# Patient Record
Sex: Female | Born: 1995 | Race: Black or African American | Hispanic: No | Marital: Single | Smoking: Former smoker
Health system: Southern US, Community
[De-identification: ages and names within clinical notes are randomized; demographics above are authoritative.]

## PROBLEM LIST (undated history)

## (undated) ENCOUNTER — Inpatient Hospital Stay (HOSPITAL_COMMUNITY): Payer: Self-pay

## (undated) ENCOUNTER — Ambulatory Visit (HOSPITAL_COMMUNITY): Admission: EM | Source: Home / Self Care

## (undated) DIAGNOSIS — I10 Essential (primary) hypertension: Secondary | ICD-10-CM

## (undated) DIAGNOSIS — J45909 Unspecified asthma, uncomplicated: Secondary | ICD-10-CM

## (undated) DIAGNOSIS — O24419 Gestational diabetes mellitus in pregnancy, unspecified control: Secondary | ICD-10-CM

## (undated) HISTORY — DX: Unspecified asthma, uncomplicated: J45.909

## (undated) HISTORY — PX: NO PAST SURGERIES: SHX2092

---

## 2016-09-27 ENCOUNTER — Encounter (HOSPITAL_COMMUNITY): Payer: Self-pay | Admitting: *Deleted

## 2016-09-27 ENCOUNTER — Ambulatory Visit (HOSPITAL_COMMUNITY)
Admission: EM | Admit: 2016-09-27 | Discharge: 2016-09-27 | Disposition: A | Discharge status: Home / Self Care | Payer: Medicaid Other | Attending: Emergency Medicine | Admitting: Emergency Medicine

## 2016-09-27 DIAGNOSIS — K5901 Slow transit constipation: Secondary | ICD-10-CM | POA: Diagnosis not present

## 2016-09-27 DIAGNOSIS — R112 Nausea with vomiting, unspecified: Secondary | ICD-10-CM | POA: Diagnosis not present

## 2016-09-27 DIAGNOSIS — N912 Amenorrhea, unspecified: Secondary | ICD-10-CM | POA: Diagnosis not present

## 2016-09-27 DIAGNOSIS — R110 Nausea: Secondary | ICD-10-CM

## 2016-09-27 MED ORDER — ONDANSETRON HCL 4 MG PO TABS: 4.0000 mg | ORAL_TABLET | Freq: Four times a day (QID) | ORAL | 0 refills | Status: DC

## 2016-09-27 NOTE — Discharge Instructions (Signed)
Your pregnancy test was negative. Uncertain as to the reason you are having nausea and vomiting. Possibly due to constipation. Recommend you utilize the MiraLAX as discussed. Drink more fluids. He may use the Zofran for nausea and vomiting as needed. Follow-up with your primary care provider or gynecologist as needed for menstrual problems.

## 2016-09-27 NOTE — ED Triage Notes (Signed)
Pt  Nausea    And     Late   On   On  Period   Vomiting    Craving   Weird  Food     Has  Not    Taken  A  Home  preg  Test

## 2016-09-27 NOTE — ED Provider Notes (Signed)
CSN: [id number]     Arrival date & time 09/27/16  1316 History   First MD Initiated Contact with Patient 09/27/16 1511     Chief Complaint  Patient presents with  . Nausea   (Consider location/radiation/quality/duration/timing/severity/associated sxs/prior Treatment) 21 year old female states that last year she was on Depo-Medrol for birth control. Her last injection was in August. She had her first period and January. No menses in February or this month. She is concerned about pregnancy. She is also complaining of nausea and vomiting proximally every other day up to 2 times a day. She is feeling drowsy, she is craving foods that she usually does not crave and she sleeping more. Her GYN placed her on OCPs but because it caused abdominal pain she stopped taking them. She does not have chest pain, abdominal pain or urinary symptoms. Denies vaginal discharge or bleeding. States she does not have regular bowel movements and believe she is constipated. She is complaining of occasional discomfort that radiates from the bilateral abdomen to the lower mid abdomen.      History reviewed. No pertinent past medical history. History reviewed. No pertinent surgical history. History reviewed. No pertinent family history. Social History  Substance Use Topics  . Smoking status: Current Every Day Smoker  . Smokeless tobacco: Not on file  . Alcohol use Yes   OB History    No data available     Review of Systems  Constitutional: Negative for chills and fever.  HENT: Negative.   Respiratory: Negative.   Cardiovascular: Negative.   Gastrointestinal: Positive for constipation, nausea and vomiting. Negative for abdominal distention, abdominal pain and blood in stool.  Genitourinary: Positive for menstrual problem. Negative for decreased urine volume, dysuria, frequency, pelvic pain, vaginal discharge and vaginal pain.  Musculoskeletal: Negative.   Neurological: Negative.   All other systems reviewed and  are negative.   Allergies  Patient has no known allergies.  Home Medications   Prior to Admission medications   Medication Sig Start Date End Date Taking? Authorizing Provider  ondansetron (ZOFRAN) 4 MG tablet Take 1 tablet (4 mg total) by mouth every 6 (six) hours. 09/27/16   Hayden Rasmussenavid Alyn Riedinger, NP   Meds Ordered and Administered this Visit  Medications - No data to display  LMP 07/22/2016 (Within Days)  No data found.   Physical Exam  Constitutional: She is oriented to person, place, and time. She appears well-developed and well-nourished. No distress.  Eyes: EOM are normal.  Neck: Normal range of motion. Neck supple.  Cardiovascular: Normal rate, regular rhythm, normal heart sounds and intact distal pulses.  Exam reveals no friction rub.   No murmur heard. Pulmonary/Chest: Effort normal and breath sounds normal. No respiratory distress. She has no wheezes. She has no rales. She exhibits no tenderness.  Abdominal: Soft. Bowel sounds are normal. She exhibits no distension and no mass. There is no tenderness. There is no rebound and no guarding.  Deep palpation of the anterior pelvis does not produce tenderness.  Musculoskeletal: Normal range of motion. She exhibits no edema.  Neurological: She is alert and oriented to person, place, and time. She exhibits normal muscle tone.  Skin: Skin is warm and dry.  Psychiatric: She has a normal mood and affect.  Nursing note and vitals reviewed.   Urgent Care Course     Procedures (including critical care time)  Labs Review Labs Reviewed - No data to display  Imaging Review No results found.   Visual Acuity Review  Right Eye  Distance:   Left Eye Distance:   Bilateral Distance:    Right Eye Near:   Left Eye Near:    Bilateral Near:         MDM   1. Nausea   2. Nausea and vomiting, intractability of vomiting not specified, unspecified vomiting type   3. Slow transit constipation   4. Amenorrhea    Your pregnancy test was  negative. Uncertain as to the reason you are having nausea and vomiting. Possibly due to constipation. Recommend you utilize the MiraLAX as discussed. Drink more fluids. He may use the Zofran for nausea and vomiting as needed. Follow-up with your primary care provider or gynecologist as needed for menstrual problems. Meds ordered this encounter  Medications  . ondansetron (ZOFRAN) 4 MG tablet    Sig: Take 1 tablet (4 mg total) by mouth every 6 (six) hours.    Dispense:  12 tablet    Refill:  0    Order Specific Question:   Supervising Provider    Answer:   Domenick Gong [[id number]]       Hayden Rasmussen, NP 09/27/16 1537

## 2017-01-13 LAB — CYSTIC FIBROSIS DIAGNOSTIC STUDY: Interpretation-CFDNA:: NEGATIVE

## 2017-01-13 LAB — OB RESULTS CONSOLE ABO/RH: RH TYPE: POSITIVE

## 2017-01-13 LAB — OB RESULTS CONSOLE GC/CHLAMYDIA
Chlamydia: NEGATIVE
Gonorrhea: NEGATIVE

## 2017-01-13 LAB — OB RESULTS CONSOLE VARICELLA ZOSTER ANTIBODY, IGG: Varicella: IMMUNE

## 2017-01-13 LAB — OB RESULTS CONSOLE ANTIBODY SCREEN: Antibody Screen: NEGATIVE

## 2017-01-13 LAB — OB RESULTS CONSOLE HEPATITIS B SURFACE ANTIGEN: Hepatitis B Surface Ag: NEGATIVE

## 2017-01-13 LAB — OB RESULTS CONSOLE PLATELET COUNT: Platelets: 335

## 2017-01-13 LAB — GLUCOSE TOLERANCE, 1 HOUR (50G) W/O FASTING: Glucose, GTT - 1 Hour: 132 (ref ?–200)

## 2017-01-13 LAB — OB RESULTS CONSOLE HGB/HCT, BLOOD
HEMATOCRIT: 35
HEMOGLOBIN: 11.3

## 2017-01-13 LAB — OB RESULTS CONSOLE RPR: RPR: NONREACTIVE

## 2017-01-13 LAB — CULTURE, OB URINE: Urine Culture, OB: NORMAL

## 2017-01-13 LAB — CYTOLOGY - PAP: Pap: NEGATIVE

## 2017-01-13 LAB — OB RESULTS CONSOLE HIV ANTIBODY (ROUTINE TESTING): HIV: NONREACTIVE

## 2017-01-13 LAB — OB RESULTS CONSOLE RUBELLA ANTIBODY, IGM: Rubella: IMMUNE

## 2017-01-30 ENCOUNTER — Encounter: Payer: Self-pay | Admitting: *Deleted

## 2017-02-03 ENCOUNTER — Ambulatory Visit: Payer: Medicaid Other | Admitting: Obstetrics and Gynecology

## 2017-02-03 ENCOUNTER — Encounter: Payer: Self-pay | Admitting: Obstetrics and Gynecology

## 2017-02-03 DIAGNOSIS — O0990 Supervision of high risk pregnancy, unspecified, unspecified trimester: Secondary | ICD-10-CM

## 2017-02-03 NOTE — Progress Notes (Signed)
Pt left without seeing provider. Stated that she has to pick up her children. Pt given ultrasound appointment and OB appt rescheduled.

## 2017-02-03 NOTE — Progress Notes (Signed)
Pt She arrived in the morning for her afternoon appt d/t transportation issues. Pt informed that she would be seen as a [place] but she left without being seen.

## 2017-02-19 ENCOUNTER — Other Ambulatory Visit: Payer: Self-pay | Admitting: Obstetrics and Gynecology

## 2017-02-19 ENCOUNTER — Other Ambulatory Visit (HOSPITAL_COMMUNITY): Payer: Self-pay | Admitting: *Deleted

## 2017-02-19 ENCOUNTER — Ambulatory Visit (HOSPITAL_COMMUNITY)
Admission: RE | Admit: 2017-02-19 | Discharge: 2017-02-19 | Disposition: A | Discharge status: Home / Self Care | Payer: Medicaid Other | Source: Ambulatory Visit | Attending: Obstetrics and Gynecology | Admitting: Obstetrics and Gynecology

## 2017-02-19 ENCOUNTER — Encounter (HOSPITAL_COMMUNITY): Payer: Self-pay

## 2017-02-19 DIAGNOSIS — O359XX1 Maternal care for (suspected) fetal abnormality and damage, unspecified, fetus 1: Secondary | ICD-10-CM

## 2017-02-19 DIAGNOSIS — Z3A23 23 weeks gestation of pregnancy: Secondary | ICD-10-CM | POA: Insufficient documentation

## 2017-02-19 DIAGNOSIS — O0990 Supervision of high risk pregnancy, unspecified, unspecified trimester: Secondary | ICD-10-CM

## 2017-02-19 DIAGNOSIS — O30032 Twin pregnancy, monochorionic/diamniotic, second trimester: Secondary | ICD-10-CM

## 2017-02-19 DIAGNOSIS — O30039 Twin pregnancy, monochorionic/diamniotic, unspecified trimester: Secondary | ICD-10-CM

## 2017-02-21 ENCOUNTER — Encounter: Payer: Self-pay | Admitting: Obstetrics and Gynecology

## 2017-02-21 ENCOUNTER — Ambulatory Visit (INDEPENDENT_AMBULATORY_CARE_PROVIDER_SITE_OTHER): Payer: Medicaid Other | Admitting: Obstetrics and Gynecology

## 2017-02-21 VITALS — BP 104/73 | HR 90 | Wt 227.7 lb

## 2017-02-21 DIAGNOSIS — O358XX1 Maternal care for other (suspected) fetal abnormality and damage, fetus 1: Secondary | ICD-10-CM

## 2017-02-21 DIAGNOSIS — J4520 Mild intermittent asthma, uncomplicated: Secondary | ICD-10-CM

## 2017-02-21 DIAGNOSIS — O35DXX0 Maternal care for other (suspected) fetal abnormality and damage, fetal gastrointestinal anomalies, not applicable or unspecified: Secondary | ICD-10-CM | POA: Insufficient documentation

## 2017-02-21 DIAGNOSIS — O0990 Supervision of high risk pregnancy, unspecified, unspecified trimester: Secondary | ICD-10-CM

## 2017-02-21 DIAGNOSIS — O35DXX1 Maternal care for other (suspected) fetal abnormality and damage, fetal gastrointestinal anomalies, fetus 1: Secondary | ICD-10-CM

## 2017-02-21 DIAGNOSIS — J45909 Unspecified asthma, uncomplicated: Secondary | ICD-10-CM | POA: Insufficient documentation

## 2017-02-21 DIAGNOSIS — F909 Attention-deficit hyperactivity disorder, unspecified type: Secondary | ICD-10-CM | POA: Insufficient documentation

## 2017-02-21 DIAGNOSIS — O35HXX0 Maternal care for other (suspected) fetal abnormality and damage, fetal lower extremities anomalies, not applicable or unspecified: Secondary | ICD-10-CM | POA: Insufficient documentation

## 2017-02-21 DIAGNOSIS — O30032 Twin pregnancy, monochorionic/diamniotic, second trimester: Secondary | ICD-10-CM

## 2017-02-21 DIAGNOSIS — O0992 Supervision of high risk pregnancy, unspecified, second trimester: Secondary | ICD-10-CM

## 2017-02-21 DIAGNOSIS — O358XX0 Maternal care for other (suspected) fetal abnormality and damage, not applicable or unspecified: Secondary | ICD-10-CM

## 2017-02-21 DIAGNOSIS — O30039 Twin pregnancy, monochorionic/diamniotic, unspecified trimester: Secondary | ICD-10-CM | POA: Insufficient documentation

## 2017-02-21 DIAGNOSIS — O35HXX1 Maternal care for other (suspected) fetal abnormality and damage, fetal lower extremities anomalies, fetus 1: Secondary | ICD-10-CM

## 2017-02-21 NOTE — Progress Notes (Signed)
Patient has decided not to breastfeed Would like to see a Child psychotherapistsocial worker, concerned with financial diffuculty

## 2017-02-21 NOTE — Patient Instructions (Signed)
Multiple Pregnancy Having a multiple pregnancy means that a woman is carrying more than one baby at a time. She may be pregnant with twins, triplets, or more. The majority of multiple pregnancies are twins. Naturally conceiving triplets or more (higher-order multiples) is rare. Multiple pregnancies are riskier than single pregnancies. A woman with a multiple pregnancy is more likely to have certain problems during her pregnancy. Therefore, she will need to have more frequent appointments for prenatal care. How does a multiple pregnancy happen? A multiple pregnancy happens when:  The woman's body releases more than one egg at a time, and then each egg gets fertilized by a different sperm. ? This is the most common type of multiple pregnancy. ? Twins or other multiples produced this way are fraternal. They are no more alike than non-multiple siblings are.  One sperm fertilizes one egg, which then divides into more than one embryo. ? Twins or other multiples produced this way are identical. Identical multiples are always the same gender, and they look very much alike.  Who is most likely to have a multiple pregnancy? A multiple pregnancy is more likely to develop in women who:  Have had fertility treatment, especially if the treatment included fertility drugs.  Are older than 21 years of age.  Have already had four or more children.  Have a family history of multiple pregnancy.  How is a multiple pregnancy diagnosed? A multiple pregnancy may be diagnosed based on:  Symptoms such as: ? Rapid weight gain in the first 3 months of pregnancy (first trimester). ? More severe nausea and breast tenderness than what is typical of a single pregnancy. ? The uterus measuring larger than what is normal for the stage of the pregnancy.  Blood tests that detect a higher-than-normal level of human chorionic gonadotropin (hCG). This is a hormone that your body produces in early pregnancy.  Ultrasound  exam. This is used to confirm that you are carrying multiples.  What risks are associated with multiple pregnancy? A multiple pregnancy puts you at a higher risk for certain problems during or after your pregnancy, including:  Having your babies delivered before you have reached a full-term pregnancy (preterm birth). A full-term pregnancy lasts for at least 37 weeks. Babies born before 53 weeks may have a higher risk of a variety of health problems, such as breathing problems, feeding difficulties, cerebral palsy, and learning disabilities.  Diabetes.  Preeclampsia. This is a serious condition that causes high blood pressure along with other symptoms, such as swelling and headaches, during pregnancy.  Excessive blood loss after childbirth (postpartum hemorrhage).  Postpartum depression.  Low birth weight of the babies.  How will having a multiple pregnancy affect my care? Your health care provider will want to monitor you more closely during your pregnancy to make sure that your babies are growing normally and that you are healthy. Follow these instructions at home: Because your pregnancy is considered to be high risk, you will need to work closely with your health care team. You may also need to make some lifestyle changes. These may include the following: Eating and drinking  Increase your nutrition. ? Follow your health care provider's recommendations for weight gain. You may need to gain a little extra weight when you are pregnant with multiples. ? Eat healthy snacks often throughout the day. This can add calories and reduce nausea.  Drink enough fluid to keep your urine clear or pale yellow.  Take prenatal vitamins. Activity By 20-24 weeks, you may  need to limit your activities.  Avoid activities and work that take a lot of effort (are strenuous).  Ask your health care provider when you should stop having sexual intercourse.  Rest often.  General instructions  Do not use  any products that contain nicotine or tobacco, such as cigarettes and e-cigarettes. If you need help quitting, ask your health care provider.  Do not drink alcohol or use illegal drugs.  Take over-the-counter and prescription medicines only as told by your health care provider.  Arrange for extra help around the house.  Keep all follow-up visits and all prenatal visits as told by your health care provider. This is important. Contact a health care provider if:  You have dizziness.  You have persistent nausea, vomiting, or diarrhea.  You are having trouble gaining weight.  You have feelings of depression or other emotions that are interfering with your normal activities. Get help right away if:  You have a fever.  You have pain with urination.  You have fluid leaking from your vagina.  You have a bad-smelling vaginal discharge.  You notice increased swelling in your face, hands, legs, or ankles.  You have spotting or bleeding from your vagina.  You have pelvic cramps, pelvic pressure, or nagging pain in your abdomen or lower back.  You are having regular contractions.  You develop a severe headache, with or without visual changes.  You have shortness of breath or chest pain.  You notice less fetal movement, or no fetal movement. This information is not intended to replace advice given to you by your health care provider. Make sure you discuss any questions you have with your health care provider. Document Released: 04/16/2008 Document Revised: 03/08/2016 Document Reviewed: 03/08/2016 Elsevier Interactive Patient Education  2018 Elsevier Inc.  

## 2017-02-21 NOTE — Progress Notes (Signed)
Subjective:  Beverly Roy is a 21 y.o. G1P0 at 3876w0d being seen today for ongoing prenatal care. [facility] d/t twins (Mono/Di) and fetal anomalies. This is her first pregnancy. H/O ADHD ( no meds) and H/O asthma ( PRN MDI ) She is currently monitored for the following issues for this high-risk pregnancy and has Supervision of high risk pregnancy, antepartum; Monochorionic diamniotic twin pregnancy; Club foot, fetal, affecting care of mother, antepartum; Gastroschisis, fetal, affecting care of mother, antepartum; Asthma; and ADHD on her problem list.  Patient reports no complaints.  Contractions: Not present. Vag. Bleeding: None.  Movement: Present. Denies leaking of fluid.   The following portions of the patient's history were reviewed and updated as appropriate: allergies, current medications, past family history, past medical history, past social history, past surgical history and problem list. Problem list updated.  Objective:   Vitals:   02/21/17 0809  BP: 104/73  Pulse: 90  Weight: 227 lb 11.2 oz (103.3 kg)    Fetal Status: Fetal Heart Rate (bpm): 145/148   Movement: Present     General:  Alert, oriented and cooperative. Patient is in no acute distress.  Skin: Skin is warm and dry. No rash noted.   Cardiovascular: Normal heart rate noted  Respiratory: Normal respiratory effort, no problems with respiration noted  Abdomen: Soft, gravid, appropriate for gestational age. Pain/Pressure: Present     Pelvic:  Cervical exam deferred        Extremities: Normal range of motion.  Edema: None  Mental Status: Normal mood and affect. Normal behavior. Normal judgment and thought content.   Urinalysis:      Assessment and Plan:  Pregnancy: G1P0 at 6876w0d  1. Monochorionic diamniotic twin gestation in second trimester Twin gestation reviewed with pt and FOB U/S yesterday no evidence of TTTS F/U already scheduled  2. Supervision of high risk pregnancy,  antepartum Stable Continue with PNV Glucola next visit  3. Club foot of fetus affecting antepartum care of mother, fetus 1 of multiple gestation Peds Ortho referral  4. Fetal gastroschisis during pregnancy, antepartum, fetus 1 of multiple gestation Peds Surgery referral  5. Mild intermittent asthma without complication Stable  6. Attention deficit hyperactivity disorder (ADHD), unspecified ADHD type Stable, no meds  Preterm labor symptoms and general obstetric precautions including but not limited to vaginal bleeding, contractions, leaking of fluid and fetal movement were reviewed in detail with the patient. Please refer to After Visit Summary for other counseling recommendations.  Return in about 4 weeks (around 03/21/2017) for OB visit.   Hermina StaggersErvin, Aodhan Scheidt L, MD

## 2017-02-25 ENCOUNTER — Telehealth: Payer: Self-pay

## 2017-02-25 NOTE — Addendum Note (Signed)
Addended by: Garret ReddishBARNES, Sylis Ketchum M on: 02/25/2017 04:12 PM   Modules accepted: Orders

## 2017-02-25 NOTE — Addendum Note (Signed)
Addended by: Garret ReddishBARNES, Jahliyah Trice M on: 02/25/2017 05:08 PM   Modules accepted: Orders

## 2017-02-25 NOTE — Telephone Encounter (Signed)
Patient has been scheduled for a pediatric surgery referral on 02/27/17 @ 0830 at [facility]. Patient has also been scheduled for a fetal echocardiogram 02/28/17 @ 0900 at [facility] in [place]. Called patient and notified her of appointment dates, times, and office phone numbers. She voiced understanding of all information.

## 2017-02-26 ENCOUNTER — Telehealth: Payer: Self-pay | Admitting: *Deleted

## 2017-02-26 NOTE — Telephone Encounter (Signed)
Patient called because she was in touch with [facility] [place], stated she does not have transportation to this appointment and was told that she needs to reschedule at the [place] office. I explained to her that the reason for the appointment being made in [place] rather [place] was that the doctor 's schedule was booked to the point she was going to have to wait a long time to get in. However if she wanted to call the office herself to see if they could help her she could try that. The [facility] is closed today, gave patient phone number with recommendation to call first thing tomorrow to see what could be done. Patient voiced understanding.

## 2017-02-27 DIAGNOSIS — O35DXX1 Maternal care for other (suspected) fetal abnormality and damage, fetal gastrointestinal anomalies, fetus 1: Secondary | ICD-10-CM | POA: Insufficient documentation

## 2017-03-05 ENCOUNTER — Encounter (HOSPITAL_COMMUNITY): Payer: Self-pay

## 2017-03-05 ENCOUNTER — Other Ambulatory Visit (HOSPITAL_COMMUNITY): Payer: Self-pay | Admitting: *Deleted

## 2017-03-05 ENCOUNTER — Other Ambulatory Visit (HOSPITAL_COMMUNITY): Payer: Self-pay | Admitting: Maternal and Fetal Medicine

## 2017-03-05 ENCOUNTER — Ambulatory Visit (HOSPITAL_COMMUNITY)
Admission: RE | Admit: 2017-03-05 | Discharge: 2017-03-05 | Disposition: A | Discharge status: Home / Self Care | Payer: Medicaid Other | Source: Ambulatory Visit | Attending: Obstetrics and Gynecology | Admitting: Obstetrics and Gynecology

## 2017-03-05 DIAGNOSIS — Q6689 Other  specified congenital deformities of feet: Secondary | ICD-10-CM | POA: Insufficient documentation

## 2017-03-05 DIAGNOSIS — O359XX0 Maternal care for (suspected) fetal abnormality and damage, unspecified, not applicable or unspecified: Secondary | ICD-10-CM

## 2017-03-05 DIAGNOSIS — O358XX0 Maternal care for other (suspected) fetal abnormality and damage, not applicable or unspecified: Secondary | ICD-10-CM | POA: Diagnosis not present

## 2017-03-05 DIAGNOSIS — Z3A25 25 weeks gestation of pregnancy: Secondary | ICD-10-CM

## 2017-03-05 DIAGNOSIS — O30032 Twin pregnancy, monochorionic/diamniotic, second trimester: Secondary | ICD-10-CM | POA: Diagnosis not present

## 2017-03-05 DIAGNOSIS — O30039 Twin pregnancy, monochorionic/diamniotic, unspecified trimester: Secondary | ICD-10-CM | POA: Diagnosis present

## 2017-03-05 DIAGNOSIS — Q793 Gastroschisis: Secondary | ICD-10-CM | POA: Diagnosis not present

## 2017-03-11 ENCOUNTER — Ambulatory Visit (INDEPENDENT_AMBULATORY_CARE_PROVIDER_SITE_OTHER): Payer: Self-pay | Admitting: Surgery

## 2017-03-11 ENCOUNTER — Other Ambulatory Visit: Payer: Self-pay

## 2017-03-11 MED ORDER — COMFORT FIT MATERNITY SUPP LG MISC: 1.0000 | 0 refills | Status: DC | PRN

## 2017-03-11 NOTE — Progress Notes (Unsigned)
Tammy, from [facility], called and informed me that the pt c/o having a sore throat starting last night. Tammy reported that the pt has not taken a temp and does not have access to a temperature probe.  I advised Tammy if she assessed her throat and if the pt is having any mucous drainage.  Pt reported no drainage and Tammy noted slight redness on sides of throat.  I advised Tammy to inform pt to monitor her for fever sx.  Also stated that the pt can take Tylenol, Chloroseptic sore throat spray, Robitussin [place] and she does not get better within a couple days to call the office.  Tammy stated that she would notify the pt of recommendations.

## 2017-03-14 ENCOUNTER — Ambulatory Visit (INDEPENDENT_AMBULATORY_CARE_PROVIDER_SITE_OTHER): Payer: Self-pay | Admitting: Surgery

## 2017-03-19 ENCOUNTER — Ambulatory Visit (HOSPITAL_COMMUNITY)
Admission: RE | Admit: 2017-03-19 | Discharge: 2017-03-19 | Disposition: A | Discharge status: Home / Self Care | Payer: Medicaid Other | Source: Ambulatory Visit | Attending: Obstetrics and Gynecology | Admitting: Obstetrics and Gynecology

## 2017-03-19 ENCOUNTER — Encounter (HOSPITAL_COMMUNITY): Payer: Self-pay

## 2017-03-19 DIAGNOSIS — O30032 Twin pregnancy, monochorionic/diamniotic, second trimester: Secondary | ICD-10-CM | POA: Diagnosis not present

## 2017-03-19 DIAGNOSIS — Z3A26 26 weeks gestation of pregnancy: Secondary | ICD-10-CM | POA: Diagnosis not present

## 2017-03-19 DIAGNOSIS — O30039 Twin pregnancy, monochorionic/diamniotic, unspecified trimester: Secondary | ICD-10-CM

## 2017-03-20 ENCOUNTER — Encounter: Payer: Medicaid Other | Admitting: Obstetrics & Gynecology

## 2017-03-20 ENCOUNTER — Encounter (HOSPITAL_COMMUNITY): Payer: Self-pay

## 2017-03-27 ENCOUNTER — Inpatient Hospital Stay (HOSPITAL_COMMUNITY)
Admission: AD | Admit: 2017-03-27 | Discharge: 2017-03-28 | Disposition: A | Discharge status: Home / Self Care | Payer: Medicaid Other | Source: Ambulatory Visit | Attending: Obstetrics and Gynecology | Admitting: Obstetrics and Gynecology

## 2017-03-27 ENCOUNTER — Encounter (HOSPITAL_COMMUNITY): Payer: Self-pay

## 2017-03-27 DIAGNOSIS — Z3A29 29 weeks gestation of pregnancy: Secondary | ICD-10-CM

## 2017-03-27 DIAGNOSIS — Z87891 Personal history of nicotine dependence: Secondary | ICD-10-CM | POA: Insufficient documentation

## 2017-03-27 DIAGNOSIS — O26893 Other specified pregnancy related conditions, third trimester: Secondary | ICD-10-CM | POA: Diagnosis present

## 2017-03-27 DIAGNOSIS — O30033 Twin pregnancy, monochorionic/diamniotic, third trimester: Secondary | ICD-10-CM | POA: Insufficient documentation

## 2017-03-27 DIAGNOSIS — O133 Gestational [pregnancy-induced] hypertension without significant proteinuria, third trimester: Secondary | ICD-10-CM | POA: Diagnosis not present

## 2017-03-27 DIAGNOSIS — Z3A38 38 weeks gestation of pregnancy: Secondary | ICD-10-CM | POA: Diagnosis not present

## 2017-03-27 LAB — AMNISURE RUPTURE OF MEMBRANE (ROM) NOT AT ARMC: AMNISURE: NEGATIVE

## 2017-03-27 LAB — POCT FERN TEST: POCT Fern Test: NEGATIVE

## 2017-03-27 NOTE — MAU Provider Note (Signed)
History     CSN: [id number]  Arrival date and time: 03/27/17 2247   First Provider Initiated Contact with Patient 03/27/17 2307      Chief Complaint  Patient presents with  . Rupture of Membranes   Beverly Roy is a 21 y.o. G1P0 at 1341w6d with mono/di twins. She presents today with leaking of fluid. She states around 2200 she had a small gush when she sat down. She has continued to leak. She denies any contractions, pain or vaginal bleeding. She denies any HA, visual disturbances or epigastric pain. She reports normal fetal movement x 2.   Vaginal Discharge  The patient's primary symptoms include vaginal discharge. The patient's pertinent negatives include no pelvic pain. This is a new problem. The current episode started today. The problem occurs intermittently. The problem has been unchanged. The patient is experiencing no pain. Pertinent negatives include no abdominal pain, chills, dysuria, fever, headaches, nausea or vomiting. The vaginal discharge was watery. There has been no bleeding. Nothing aggravates the symptoms. She has tried nothing for the symptoms.   Past Medical History:  Diagnosis Date  . Asthma     Past Surgical History:  Procedure Laterality Date  . NO PAST SURGERIES      History reviewed. No pertinent family history.  Social History  Substance Use Topics  . Smoking status: Former Smoker    Quit date: 10/21/2016  . Smokeless tobacco: Never Used  . Alcohol use No    Allergies: No Known Allergies  Prescriptions Prior to Admission  Medication Sig Dispense Refill Last Dose  . Elastic Bandages & Supports (COMFORT FIT MATERNITY SUPP LG) MISC 1 each by Does not apply route as needed. 1 each 0   . ondansetron (ZOFRAN) 4 MG tablet Take 1 tablet (4 mg total) by mouth every 6 (six) hours. (Patient not taking: Reported on 02/19/2017) 12 tablet 0 Not Taking  . Prenatal Vit w/Fe-Methylfol-FA (PNV PO) Take by mouth.   Taking    Review of Systems  Constitutional:  Negative for chills and fever.  Eyes: Negative for visual disturbance.  Gastrointestinal: Negative for abdominal pain, nausea and vomiting.  Genitourinary: Positive for vaginal discharge. Negative for dysuria, pelvic pain and vaginal bleeding.  Neurological: Negative for headaches.   Physical Exam   Blood pressure 137/74, pulse 98, temperature 97.6 F (36.4 C), temperature source Oral, resp. rate 18, last menstrual period 08/07/2016.  Physical Exam  Nursing note and vitals reviewed. Constitutional: She is oriented to person, place, and time. She appears well-developed and well-nourished. No distress.  HENT:  Head: Normocephalic.  Cardiovascular: Normal rate.   Respiratory: Effort normal.  GI: Soft. There is no tenderness. There is no rebound.  Genitourinary:  Genitourinary Comments:  External: no lesion Vagina: small amount of white discharge, no pooling  Cervix: pink, smooth, no fluid seen with valsalva.  Uterus: AGA    Neurological: She is alert and oriented to person, place, and time.  Skin: Skin is warm.  Psychiatric: She has a normal mood and affect.   Results for orders placed or performed during the hospital encounter of 03/27/17 (from the past 24 hour(s))  Amnisure rupture of membrane (rom)not at [facility]     Status: None   Collection Time: 03/27/17 11:09 PM  Result Value Ref Range   Amnisure ROM NEGATIVE   POCT fern test     Status: Normal   Collection Time: 03/27/17 11:18 PM  Result Value Ref Range   POCT Fern Test Negative = intact amniotic  membranes   CBC     Status: Abnormal   Collection Time: 03/28/17 12:19 AM  Result Value Ref Range   WBC 9.3 4.0 - 10.5 K/uL   RBC 4.19 3.87 - 5.11 MIL/uL   Hemoglobin 11.5 (L) 12.0 - 15.0 g/dL   HCT 28.4 (L) 13.2 - 44.0 %   MCV 82.8 78.0 - 100.0 fL   MCH 27.4 26.0 - 34.0 pg   MCHC 33.1 30.0 - 36.0 g/dL   RDW 10.2 72.5 - 36.6 %   Platelets 305 150 - 400 K/uL  Comprehensive metabolic panel     Status: Abnormal   Collection  Time: 03/28/17 12:19 AM  Result Value Ref Range   Sodium 137 135 - 145 mmol/L   Potassium 3.8 3.5 - 5.1 mmol/L   Chloride 104 101 - 111 mmol/L   CO2 24 22 - 32 mmol/L   Glucose, Bld 105 (H) 65 - 99 mg/dL   BUN 5 (L) 6 - 20 mg/dL   Creatinine, Ser 4.40 0.44 - 1.00 mg/dL   Calcium 9.5 8.9 - 34.7 mg/dL   Total Protein 7.4 6.5 - 8.1 g/dL   Albumin 3.1 (L) 3.5 - 5.0 g/dL   AST 21 15 - 41 U/L   ALT 15 14 - 54 U/L   Alkaline Phosphatase 129 (H) 38 - 126 U/L   Total Bilirubin 0.3 0.3 - 1.2 mg/dL   GFR calc non Af Amer >60 >60 mL/min   GFR calc Af Amer >60 >60 mL/min   Anion gap 9 5 - 15  Protein / creatinine ratio, urine     Status: None   Collection Time: 03/28/17 12:42 AM  Result Value Ref Range   Creatinine, Urine 74.00 mg/dL   Total Protein, Urine <6 mg/dL   Protein Creatinine Ratio        0.00 - 0.15 mg/mg[Cre]  Urinalysis, Routine w reflex microscopic     Status: None   Collection Time: 03/28/17 12:42 AM  Result Value Ref Range   Color, Urine YELLOW YELLOW   APPearance CLEAR CLEAR   Specific Gravity, Urine 1.005 1.005 - 1.030   pH 6.0 5.0 - 8.0   Glucose, UA NEGATIVE NEGATIVE mg/dL   Hgb urine dipstick NEGATIVE NEGATIVE   Bilirubin Urine NEGATIVE NEGATIVE   Ketones, ur NEGATIVE NEGATIVE mg/dL   Protein, ur NEGATIVE NEGATIVE mg/dL   Nitrite NEGATIVE NEGATIVE   Leukocytes, UA NEGATIVE NEGATIVE   FHT: A: 150, moderate with 15x15 accels, no decels B: 160, moderate with 15x15 accels, no decels Toco: no UCs  MAU Course  Procedures  MDM   Assessment and Plan   1. Pregnancy-induced hypertension in third trimester   2. [redacted] weeks gestation of pregnancy    DC home Comfort measures reviewed  3rd Trimester precautions  Blood pressure information given  PTL precautions  Fetal kick counts RX: none  Return to MAU as needed FU with OB as planned Patient knows to come on Monday for blood pressure check. She also has a nurse through the health department that can come to her  house as well if needed.   Follow-up Information    [facility] up.   Specialty:  Obstetrics and Gynecology Why:  Monday for a blood pressure check. Clinic will call you to confirm time.  Contact information: [address] [place] [address] [phone]           Thressa Sheller 03/28/2017, 1:45 AM

## 2017-03-27 NOTE — MAU Note (Signed)
Pt presents to MAU with PROM. Felt gush of fluid 2200. +FM, denies vaginal bleeding & discharge.

## 2017-03-28 DIAGNOSIS — Z3A38 38 weeks gestation of pregnancy: Secondary | ICD-10-CM | POA: Diagnosis not present

## 2017-03-28 DIAGNOSIS — O133 Gestational [pregnancy-induced] hypertension without significant proteinuria, third trimester: Secondary | ICD-10-CM

## 2017-03-28 LAB — URINALYSIS, ROUTINE W REFLEX MICROSCOPIC
Bilirubin Urine: NEGATIVE
Glucose, UA: NEGATIVE mg/dL
HGB URINE DIPSTICK: NEGATIVE
KETONES UR: NEGATIVE mg/dL
Leukocytes, UA: NEGATIVE
Nitrite: NEGATIVE
PROTEIN: NEGATIVE mg/dL
Specific Gravity, Urine: 1.005 (ref 1.005–1.030)
pH: 6 (ref 5.0–8.0)

## 2017-03-28 LAB — COMPREHENSIVE METABOLIC PANEL
ALBUMIN: 3.1 g/dL — AB (ref 3.5–5.0)
ALT: 15 U/L (ref 14–54)
ANION GAP: 9 (ref 5–15)
AST: 21 U/L (ref 15–41)
Alkaline Phosphatase: 129 U/L — ABNORMAL HIGH (ref 38–126)
BUN: 5 mg/dL — ABNORMAL LOW (ref 6–20)
CO2: 24 mmol/L (ref 22–32)
Calcium: 9.5 mg/dL (ref 8.9–10.3)
Chloride: 104 mmol/L (ref 101–111)
Creatinine, Ser: 0.6 mg/dL (ref 0.44–1.00)
GFR calc Af Amer: >60 mL/min (ref >60)
GFR calc non Af Amer: >60 mL/min (ref >60)
GLUCOSE: 105 mg/dL — AB (ref 65–99)
POTASSIUM: 3.8 mmol/L (ref 3.5–5.1)
SODIUM: 137 mmol/L (ref 135–145)
TOTAL PROTEIN: 7.4 g/dL (ref 6.5–8.1)
Total Bilirubin: 0.3 mg/dL (ref 0.3–1.2)

## 2017-03-28 LAB — PROTEIN / CREATININE RATIO, URINE: Creatinine, Urine: 74 mg/dL

## 2017-03-28 LAB — CBC
HCT: 34.7 % — ABNORMAL LOW (ref 36.0–46.0)
Hemoglobin: 11.5 g/dL — ABNORMAL LOW (ref 12.0–15.0)
MCH: 27.4 pg (ref 26.0–34.0)
MCHC: 33.1 g/dL (ref 30.0–36.0)
MCV: 82.8 fL (ref 78.0–100.0)
Platelets: 305 10*3/uL (ref 150–400)
RBC: 4.19 MIL/uL (ref 3.87–5.11)
RDW: 14.3 % (ref 11.5–15.5)
WBC: 9.3 10*3/uL (ref 4.0–10.5)

## 2017-03-28 NOTE — Discharge Instructions (Signed)
Hypertension During Pregnancy Hypertension is also called high blood pressure. High blood pressure means that the force of your blood moving in your body is too strong. When you are pregnant, this condition should be watched carefully. It can cause problems for you and your baby. Follow these instructions at home: Eating and drinking  Drink enough fluid to keep your pee (urine) clear or pale yellow.  Eat healthy foods that are low in salt (sodium). ? Do not add salt to your food. ? Check labels on foods and drinks to see much salt is in them. Look on the label where you see "Sodium." Lifestyle  Do not use any products that contain nicotine or tobacco, such as cigarettes and e-cigarettes. If you need help quitting, ask your doctor.  Do not use alcohol.  Avoid caffeine.  Avoid stress. Rest and get plenty of sleep. General instructions  Take over-the-counter and prescription medicines only as told by your doctor.  While lying down, lie on your left side. This keeps pressure off your baby.  While sitting or lying down, raise (elevate) your feet. Try putting some pillows under your lower legs.  Exercise regularly. Ask your doctor what kinds of exercise are best for you.  Keep all prenatal and follow-up visits as told by your doctor. This is important. Contact a doctor if:  You have symptoms that your doctor told you to watch for, such as: ? Fever. ? Throwing up (vomiting). ? Headache. Get help right away if:  You have very bad pain in your belly (abdomen).  You are throwing up, and this does not get better with treatment.  You suddenly get swelling in your hands, ankles, or face.  You gain 4 lb (1.8 kg) or more in 1 week.  You get bleeding from your vagina.  You have blood in your pee.  You do not feel your baby moving as much as normal.  You have a change in vision.  You have muscle twitching or sudden tightening (spasms).  You have trouble breathing.  Your lips  or fingernails turn blue. This information is not intended to replace advice given to you by your health care provider. Make sure you discuss any questions you have with your health care provider. Document Released: 08/10/2010 Document Revised: 03/19/2016 Document Reviewed: 03/19/2016 Elsevier Interactive Patient Education  2017 Elsevier Inc.  

## 2017-03-31 ENCOUNTER — Telehealth: Payer: Self-pay | Admitting: General Practice

## 2017-03-31 ENCOUNTER — Encounter: Payer: Medicaid Other | Admitting: Obstetrics & Gynecology

## 2017-03-31 NOTE — Telephone Encounter (Signed)
Patient missed OB appointment today at 2:40pm.  Called and notified patient of next follow up OB appointment on 04/04/17 at 9:00am. Patient voiced understanding.

## 2017-04-02 ENCOUNTER — Other Ambulatory Visit (HOSPITAL_COMMUNITY): Payer: Self-pay | Admitting: Maternal and Fetal Medicine

## 2017-04-02 ENCOUNTER — Encounter (HOSPITAL_COMMUNITY): Payer: Self-pay

## 2017-04-02 ENCOUNTER — Other Ambulatory Visit (HOSPITAL_COMMUNITY): Payer: Self-pay | Admitting: *Deleted

## 2017-04-02 ENCOUNTER — Ambulatory Visit (HOSPITAL_COMMUNITY)
Admission: RE | Admit: 2017-04-02 | Discharge: 2017-04-02 | Disposition: A | Discharge status: Home / Self Care | Payer: Medicaid Other | Source: Ambulatory Visit | Attending: Family Medicine | Admitting: Family Medicine

## 2017-04-02 DIAGNOSIS — Z3A29 29 weeks gestation of pregnancy: Secondary | ICD-10-CM

## 2017-04-02 DIAGNOSIS — Q6602 Congenital talipes equinovarus, left foot: Secondary | ICD-10-CM

## 2017-04-02 DIAGNOSIS — Q660 Congenital talipes equinovarus: Secondary | ICD-10-CM | POA: Diagnosis present

## 2017-04-02 DIAGNOSIS — O30039 Twin pregnancy, monochorionic/diamniotic, unspecified trimester: Secondary | ICD-10-CM

## 2017-04-02 DIAGNOSIS — O359XX0 Maternal care for (suspected) fetal abnormality and damage, unspecified, not applicable or unspecified: Secondary | ICD-10-CM

## 2017-04-02 DIAGNOSIS — O35DXX0 Maternal care for other (suspected) fetal abnormality and damage, fetal gastrointestinal anomalies, not applicable or unspecified: Secondary | ICD-10-CM

## 2017-04-02 DIAGNOSIS — O358XX0 Maternal care for other (suspected) fetal abnormality and damage, not applicable or unspecified: Secondary | ICD-10-CM

## 2017-04-02 DIAGNOSIS — O30033 Twin pregnancy, monochorionic/diamniotic, third trimester: Secondary | ICD-10-CM

## 2017-04-02 NOTE — ED Notes (Signed)
[facility] [place] [facility] [place] [facility] [place] [facility] [place]  Instructed pt to call her Careers information officermedicaid worker and begin a transportation assessment.  Phone number for transportation given to pt.  Pt voiced understanding.

## 2017-04-03 ENCOUNTER — Encounter: Payer: Medicaid Other | Admitting: Obstetrics & Gynecology

## 2017-04-04 ENCOUNTER — Encounter: Payer: Medicaid Other | Admitting: Obstetrics & Gynecology

## 2017-04-15 ENCOUNTER — Encounter (HOSPITAL_COMMUNITY): Payer: Self-pay | Admitting: *Deleted

## 2017-04-15 ENCOUNTER — Encounter: Payer: Medicaid Other | Admitting: Obstetrics & Gynecology

## 2017-04-15 ENCOUNTER — Inpatient Hospital Stay (HOSPITAL_COMMUNITY)
Admission: AD | Admit: 2017-04-15 | Discharge: 2017-04-15 | Disposition: A | Discharge status: Home / Self Care | Payer: Medicaid Other | Source: Ambulatory Visit | Attending: Family Medicine | Admitting: Family Medicine

## 2017-04-15 DIAGNOSIS — O26893 Other specified pregnancy related conditions, third trimester: Secondary | ICD-10-CM | POA: Diagnosis not present

## 2017-04-15 DIAGNOSIS — O30003 Twin pregnancy, unspecified number of placenta and unspecified number of amniotic sacs, third trimester: Secondary | ICD-10-CM | POA: Insufficient documentation

## 2017-04-15 DIAGNOSIS — O30033 Twin pregnancy, monochorionic/diamniotic, third trimester: Secondary | ICD-10-CM

## 2017-04-15 DIAGNOSIS — M549 Dorsalgia, unspecified: Secondary | ICD-10-CM | POA: Insufficient documentation

## 2017-04-15 DIAGNOSIS — O99513 Diseases of the respiratory system complicating pregnancy, third trimester: Secondary | ICD-10-CM | POA: Diagnosis not present

## 2017-04-15 DIAGNOSIS — R109 Unspecified abdominal pain: Secondary | ICD-10-CM | POA: Diagnosis not present

## 2017-04-15 DIAGNOSIS — O0990 Supervision of high risk pregnancy, unspecified, unspecified trimester: Secondary | ICD-10-CM

## 2017-04-15 DIAGNOSIS — J45909 Unspecified asthma, uncomplicated: Secondary | ICD-10-CM | POA: Insufficient documentation

## 2017-04-15 DIAGNOSIS — O26899 Other specified pregnancy related conditions, unspecified trimester: Secondary | ICD-10-CM

## 2017-04-15 DIAGNOSIS — Z87891 Personal history of nicotine dependence: Secondary | ICD-10-CM | POA: Insufficient documentation

## 2017-04-15 DIAGNOSIS — O09893 Supervision of other high risk pregnancies, third trimester: Secondary | ICD-10-CM | POA: Diagnosis not present

## 2017-04-15 DIAGNOSIS — Z3A31 31 weeks gestation of pregnancy: Secondary | ICD-10-CM | POA: Diagnosis present

## 2017-04-15 DIAGNOSIS — O318X32 Other complications specific to multiple gestation, third trimester, fetus 2: Secondary | ICD-10-CM | POA: Diagnosis not present

## 2017-04-15 DIAGNOSIS — N898 Other specified noninflammatory disorders of vagina: Secondary | ICD-10-CM | POA: Insufficient documentation

## 2017-04-15 DIAGNOSIS — O318X31 Other complications specific to multiple gestation, third trimester, fetus 1: Secondary | ICD-10-CM | POA: Insufficient documentation

## 2017-04-15 LAB — AMNISURE RUPTURE OF MEMBRANE (ROM) NOT AT ARMC: Amnisure ROM: NEGATIVE

## 2017-04-15 LAB — URINALYSIS, ROUTINE W REFLEX MICROSCOPIC
Bilirubin Urine: NEGATIVE
GLUCOSE, UA: NEGATIVE mg/dL
Hgb urine dipstick: NEGATIVE
KETONES UR: 20 mg/dL — AB
Nitrite: NEGATIVE
PH: 5 (ref 5.0–8.0)
Protein, ur: 30 mg/dL — AB
Specific Gravity, Urine: 1.025 (ref 1.005–1.030)

## 2017-04-15 LAB — WET PREP, GENITAL
Clue Cells Wet Prep HPF POC: NONE SEEN
SPERM: NONE SEEN
TRICH WET PREP: NONE SEEN
Yeast Wet Prep HPF POC: NONE SEEN

## 2017-04-15 MED ORDER — BETAMETHASONE SOD PHOS & ACET 6 (3-3) MG/ML IJ SUSP
12.0000 mg | INTRAMUSCULAR | Status: DC
  Administered 2017-04-15: 12 mg via INTRAMUSCULAR
  Filled 2017-04-15: qty 2

## 2017-04-15 MED ORDER — BETAMETHASONE SOD PHOS & ACET 6 (3-3) MG/ML IJ SUSP: 12.0000 mg | Freq: Once | INTRAMUSCULAR | Status: DC

## 2017-04-15 MED ORDER — NIFEDIPINE 10 MG PO CAPS: 10.0000 mg | ORAL_CAPSULE | ORAL | 1 refills | Status: DC | PRN

## 2017-04-15 NOTE — Discharge Instructions (Signed)
RETURN TO MAU ON Wednesday September 26 between 8 and 11 pm for repeat shot for babys' lung development!    Preventing Preterm Birth Preterm birth is when your baby is delivered between 20 weeks and 37 weeks of pregnancy. A full-term pregnancy lasts for at least 37 weeks. Preterm birth can be dangerous for your baby because the last few weeks of pregnancy are an important time for your baby's brain and lungs to grow. Many things can cause a baby to be born early. Sometimes the cause is not known. There are certain factors that make you more likely to experience preterm birth, such as:  Having a previous baby born preterm.  Being pregnant with twins or other multiples.  Having had fertility treatment.  Being overweight or underweight at the start of your pregnancy.  Having any of the following during pregnancy: ? An infection, including a urinary tract infection (UTI) or an STI (sexually transmitted infection). ? High blood pressure. ? Diabetes. ? Vaginal bleeding.  Being age 21 or older.  Being age 58 or younger.  Getting pregnant within 6 months of a previous pregnancy.  Suffering extreme stress or physical or emotional abuse during pregnancy.  Standing for long periods of time during pregnancy, such as working at a job that requires standing.  What are the risks? The most serious risk of preterm birth is that the baby may not survive. This is more likely to happen if a baby is born before 34 weeks. Other risks and complications of preterm birth may include your baby having:  Breathing problems.  Brain damage that affects movement and coordination (cerebral palsy).  Feeding difficulties.  Vision or hearing problems.  Infections or inflammation of the digestive tract (colitis).  Developmental delays.  Learning disabilities.  Higher risk for diabetes, heart disease, and high blood pressure later in life.  What can I do to lower my risk? Medical care  The most  important thing you can do to lower your risk for preterm birth is to get routine medical care during pregnancy (prenatal care). If you have a high risk of preterm birth, you may be referred to a health care provider who specializes in managing high-risk pregnancies (perinatologist). You may be given medicine to help prevent preterm birth. Lifestyle changes Certain lifestyle changes can also lower your risk of preterm birth:  Wait at least 6 months after a pregnancy to become pregnant again.  Try to plan pregnancy for when you are between 63 and 33 years old.  Get to a healthy weight before getting pregnant. If you are overweight, work with your health care provider to safely lose weight.  Do not use any products that contain nicotine or tobacco, such as cigarettes and e-cigarettes. If you need help quitting, ask your health care provider.  Do not drink alcohol.  Do not use drugs.  Where to find support: For more support, consider:  Talking with your health care provider.  Talking with a therapist or substance abuse counselor, if you need help quitting.  Working with a diet and nutrition specialist (dietitian) or a Systems analyst to maintain a healthy weight.  Joining a support group.  Where to find more information: Learn more about preventing preterm birth from:  Centers for Disease Control and Prevention: http://curry.org/  March of Dimes: marchofdimes.org/complications/premature-babies.aspx  American Pregnancy Association: americanpregnancy.org/labor-and-birth/premature-labor  Contact a health care provider if:  You have any of the following signs of preterm labor before 37 weeks: ? A change or increase in  vaginal discharge. ? Fluid leaking from your vagina. ? Pressure or cramps in your lower abdomen. ? A backache that does not go away or gets worse. ? Regular tightening (contractions) in your lower  abdomen. Summary  Preterm birth means having your baby during weeks 20-37 of pregnancy.  Preterm birth may put your baby at risk for physical and mental problems.  Getting good prenatal care can help prevent preterm birth.  You can lower your risk of preterm birth by making certain lifestyle changes, such as not smoking and not using alcohol. This information is not intended to replace advice given to you by your health care provider. Make sure you discuss any questions you have with your health care provider. Document Released: 08/22/2015 Document Revised: 03/16/2016 Document Reviewed: 03/16/2016 Elsevier Interactive Patient Education  [employer].

## 2017-04-15 NOTE — MAU Provider Note (Signed)
History   [name] is a 21 y.o. G1P0 At [redacted]w[redacted]d.  here with complaints of leaking of fluid and contractions that started at 9pm this evening. She is pregnant with mono-di twins. She denies bleeding, other ob-gyn complaint or decreased fetal movements. She has felt strong movement from both babies all day long.   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  Vaginal Discharge  The patient's primary symptoms include vaginal discharge. This is a new problem. The current episode started today. The problem occurs rarely. The problem has been resolved. The pain is mild. Associated symptoms include abdominal pain and back pain. Pertinent negatives include no chills, constipation, diarrhea, nausea or painful intercourse. The vaginal discharge was clear. There has been no bleeding. She has not been passing clots. Nothing aggravates the symptoms. She has tried nothing for the symptoms.   Patient says at 9 pm she felt some leaking from her vagina, which continued and ran down her leg. She says it was coming from her vagina and felt like water. She says it was not urine because she was not able to stop it. It was clear.  Since then she has had no more leaking, but she does feel contraction pain off and on. Her lower back is hurting her too.   OB History    Gravida Para Term Preterm AB Living   1         0   SAB TAB Ectopic Multiple Live Births                  Past Medical History:  Diagnosis Date  . Asthma     Past Surgical History:  Procedure Laterality Date  . NO PAST SURGERIES      History reviewed. No pertinent family history.  Social History  Substance Use Topics  . Smoking status: Former Smoker    Quit date: 10/21/2016  . Smokeless tobacco: Never Used  . Alcohol use No    Allergies:  No Known Allergies  Prescriptions Prior to Admission  Medication Sig Dispense Refill Last Dose  . Elastic Bandages & Supports (COMFORT FIT MATERNITY SUPP LG) MISC 1 each by Does not apply route as needed. 1 each 0   . ondansetron (ZOFRAN) 4 MG tablet Take 1 tablet (4 mg total) by mouth every 6 (six) hours. (Patient not taking: Reported on 02/19/2017) 12 tablet 0 Not Taking  . Prenatal Vit w/Fe-Methylfol-FA (PNV PO) Take by mouth.   Taking    Review of Systems  Constitutional: Negative for chills.  Gastrointestinal: Positive for abdominal pain. Negative for constipation, diarrhea and nausea.  Genitourinary: Positive for vaginal discharge.  Musculoskeletal: Positive for back pain.   Physical Exam   Blood pressure (!) 143/79, pulse (!) 102, temperature 98.3 F (36.8 C), temperature source Oral, resp. rate 17, last menstrual period 08/07/2016, SpO2 100 %.  Physical Exam  Constitutional: She is oriented to person, place, and time. She appears well-developed.  HENT:  Head: Normocephalic.  Eyes: Pupils are equal, round, and reactive to light.  GI: Soft. There is no tenderness.  Genitourinary: Vagina normal.  Genitourinary Comments: NEFG; external labia are dry. No pooling on exam, cervix is closed, soft and posterior. No CMT, no suprapubic or adnexal tenderness.   Musculoskeletal: Normal range of  motion.  Neurological: She is alert and oriented to person, place, and time.  Skin: Skin is warm and dry.  Psychiatric: She has a normal mood and affect.    MAU Course  Procedures  MDM -amnisure-negative -wet prep: negative -GC CT pending -UC pending -UA 20 of ketones -BMZ x 1 Bedside US down by Doroteo Glassman MD to establish location of twin heartbeats.   NST: Baby A 155 bpm with mod variability, present acels, occasional decels and uterine irritability.  Baby B with 150 bpm with mod variability, present acels, occasional decels and uterine irritability.   Patient feeling much better after  PO hydration.  Assessment and Plan   1. Vaginal discharge during pregnancy, antepartum   2. Supervision of high risk pregnancy, antepartum    2. Patient stable for discharge with return precautions given.  3. Patient to return to MAU tomorrow night around 8 or 9 pm for repeat BMZ shot.  4. RX for Procardia 10 mg PRN.  5. All questions answered; patient verbalized understanding.    Charlesetta Garibaldi Kooistra 04/15/2017, 11:05 PM

## 2017-04-15 NOTE — Progress Notes (Signed)
Adjusted monitors multiple times, difficult with maternal movement in bed. Asked if she could try to remain still for twenty minutes, sits up in bed.  Will readjust monitors.

## 2017-04-16 ENCOUNTER — Encounter: Payer: Medicaid Other | Admitting: Advanced Practice Midwife

## 2017-04-16 ENCOUNTER — Ambulatory Visit (HOSPITAL_COMMUNITY)
Admission: RE | Admit: 2017-04-16 | Discharge: 2017-04-16 | Disposition: A | Discharge status: Home / Self Care | Payer: Medicaid Other | Source: Ambulatory Visit | Attending: Family Medicine | Admitting: Family Medicine

## 2017-04-16 ENCOUNTER — Inpatient Hospital Stay (HOSPITAL_COMMUNITY)
Admission: AD | Admit: 2017-04-16 | Discharge: 2017-04-16 | Disposition: A | Discharge status: Home / Self Care | Payer: Medicaid Other | Source: Ambulatory Visit | Attending: Obstetrics & Gynecology | Admitting: Obstetrics & Gynecology

## 2017-04-16 ENCOUNTER — Encounter (HOSPITAL_COMMUNITY): Payer: Self-pay

## 2017-04-16 DIAGNOSIS — O133 Gestational [pregnancy-induced] hypertension without significant proteinuria, third trimester: Secondary | ICD-10-CM | POA: Diagnosis not present

## 2017-04-16 DIAGNOSIS — O30033 Twin pregnancy, monochorionic/diamniotic, third trimester: Secondary | ICD-10-CM | POA: Insufficient documentation

## 2017-04-16 DIAGNOSIS — O26893 Other specified pregnancy related conditions, third trimester: Secondary | ICD-10-CM | POA: Diagnosis present

## 2017-04-16 DIAGNOSIS — Z3A31 31 weeks gestation of pregnancy: Secondary | ICD-10-CM | POA: Diagnosis not present

## 2017-04-16 DIAGNOSIS — O4703 False labor before 37 completed weeks of gestation, third trimester: Secondary | ICD-10-CM

## 2017-04-16 DIAGNOSIS — Z87891 Personal history of nicotine dependence: Secondary | ICD-10-CM | POA: Insufficient documentation

## 2017-04-16 LAB — PROTEIN / CREATININE RATIO, URINE
CREATININE, URINE: 198 mg/dL
Protein Creatinine Ratio: 0.09 mg/mg{Cre} (ref 0.00–0.15)
Total Protein, Urine: 17 mg/dL

## 2017-04-16 LAB — CBC
HEMATOCRIT: 36.1 % (ref 36.0–46.0)
Hemoglobin: 12.1 g/dL (ref 12.0–15.0)
MCH: 27.4 pg (ref 26.0–34.0)
MCHC: 33.5 g/dL (ref 30.0–36.0)
MCV: 81.7 fL (ref 78.0–100.0)
Platelets: 308 10*3/uL (ref 150–400)
RBC: 4.42 MIL/uL (ref 3.87–5.11)
RDW: 14 % (ref 11.5–15.5)
WBC: 9.9 10*3/uL (ref 4.0–10.5)

## 2017-04-16 LAB — COMPREHENSIVE METABOLIC PANEL
ALBUMIN: 3.5 g/dL (ref 3.5–5.0)
ALT: 15 U/L (ref 14–54)
AST: 22 U/L (ref 15–41)
Alkaline Phosphatase: 158 U/L — ABNORMAL HIGH (ref 38–126)
Anion gap: 15 (ref 5–15)
BILIRUBIN TOTAL: 0.4 mg/dL (ref 0.3–1.2)
BUN: 8 mg/dL (ref 6–20)
CHLORIDE: 101 mmol/L (ref 101–111)
CO2: 20 mmol/L — ABNORMAL LOW (ref 22–32)
CREATININE: 0.67 mg/dL (ref 0.44–1.00)
Calcium: 9.3 mg/dL (ref 8.9–10.3)
GFR calc Af Amer: >60 mL/min (ref >60)
GLUCOSE: 92 mg/dL (ref 65–99)
POTASSIUM: 3.7 mmol/L (ref 3.5–5.1)
Sodium: 136 mmol/L (ref 135–145)
TOTAL PROTEIN: 8.3 g/dL — AB (ref 6.5–8.1)

## 2017-04-16 LAB — GC/CHLAMYDIA PROBE AMP (~~LOC~~) NOT AT ARMC
Chlamydia: NEGATIVE
Neisseria Gonorrhea: NEGATIVE

## 2017-04-16 MED ORDER — BETAMETHASONE SOD PHOS & ACET 6 (3-3) MG/ML IJ SUSP
12.0000 mg | Freq: Once | INTRAMUSCULAR | Status: AC
  Administered 2017-04-16: 12 mg via INTRAMUSCULAR
  Filled 2017-04-16: qty 2

## 2017-04-16 MED ORDER — NIFEDIPINE 10 MG PO CAPS
10.0000 mg | ORAL_CAPSULE | ORAL | Status: DC | PRN
  Administered 2017-04-16: 10 mg via ORAL
  Filled 2017-04-16: qty 1

## 2017-04-16 NOTE — MAU Note (Signed)
Patient presents to MAU for 2nd steroid shot. Patient states her cramping, contractions and back pain are worse then yesterday. Denies VB, LOF, or discharge. +FM

## 2017-04-16 NOTE — Discharge Instructions (Signed)
Preterm Labor and Birth Information The normal length of a pregnancy is 39-41 weeks. Preterm labor is when labor starts before 37 completed weeks of pregnancy. What are the risk factors for preterm labor? Preterm labor is more likely to occur in women who:  Have certain infections during pregnancy such as a bladder infection, sexually transmitted infection, or infection inside the uterus (chorioamnionitis).  Have a shorter-than-normal cervix.  Have gone into preterm labor before.  Have had surgery on their cervix.  Are younger than age 89 or older than age 19.  Are African American.  Are pregnant with twins or multiple babies (multiple gestation).  Take street drugs or smoke while pregnant.  Do not gain enough weight while pregnant.  Became pregnant shortly after having been pregnant.  What are the symptoms of preterm labor? Symptoms of preterm labor include:  Cramps similar to those that can happen during a menstrual period. The cramps may happen with diarrhea.  Pain in the abdomen or lower back.  Regular uterine contractions that may feel like tightening of the abdomen.  A feeling of increased pressure in the pelvis.  Increased watery or bloody mucus discharge from the vagina.  Water breaking (ruptured amniotic sac).  Why is it important to recognize signs of preterm labor? It is important to recognize signs of preterm labor because babies who are born prematurely may not be fully developed. This can put them at an increased risk for:  Long-term (chronic) heart and lung problems.  Difficulty immediately after birth with regulating body systems, including blood sugar, body temperature, heart rate, and breathing rate.  Bleeding in the brain.  Cerebral palsy.  Learning difficulties.  Death.  These risks are highest for babies who are born before 37 weeks of pregnancy. How is preterm labor treated? Treatment depends on the length of your pregnancy, your  condition, and the health of your baby. It may involve:  Having a stitch (suture) placed in your cervix to prevent your cervix from opening too early (cerclage).  Taking or being given medicines, such as: ? Hormone medicines. These may be given early in pregnancy to help support the pregnancy. ? Medicine to stop contractions. ? Medicines to help mature the babys lungs. These may be prescribed if the risk of delivery is high. ? Medicines to prevent your baby from developing cerebral palsy.  If the labor happens before 34 weeks of pregnancy, you may need to stay in the hospital. What should I do if I think I am in preterm labor? If you think that you are going into preterm labor, call your health care provider right away. How can I prevent preterm labor in future pregnancies? To increase your chance of having a full-term pregnancy:  Do not use any tobacco products, such as cigarettes, chewing tobacco, and e-cigarettes. If you need help quitting, ask your health care provider.  Do not use street drugs or medicines that have not been prescribed to you during your pregnancy.  Talk with your health care provider before taking any herbal supplements, even if you have been taking them regularly.  Make sure you gain a healthy amount of weight during your pregnancy.  Watch for infection. If you think that you might have an infection, get it checked right away.  Make sure to tell your health care provider if you have gone into preterm labor before.  This information is not intended to replace advice given to you by your health care provider. Make sure you discuss any questions  you have with your health care provider. Document Released: 09/28/2003 Document Revised: 12/19/2015 Document Reviewed: 11/29/2015 Elsevier Interactive Patient Education  2018 [employer]. Hypertension During Pregnancy Hypertension, commonly called high blood pressure, is when the force of blood pumping through your  arteries is too strong. Arteries are blood vessels that carry blood from the heart throughout the body. Hypertension during pregnancy can cause problems for you and your baby. Your baby may be born early (prematurely) or may not weigh as much as he or she should at birth. Very bad cases of hypertension during pregnancy can be life-threatening. Different types of hypertension can occur during pregnancy. These include:  Chronic hypertension. This happens when: ? You have hypertension before pregnancy and it continues during pregnancy. ? You develop hypertension before you are [redacted] weeks pregnant, and it continues during pregnancy.  Gestational hypertension. This is hypertension that develops after the 20th week of pregnancy.  Preeclampsia, also called toxemia of pregnancy. This is a very serious type of hypertension that develops only during pregnancy. It affects the whole body, and it can be very dangerous for you and your baby.  Gestational hypertension and preeclampsia usually go away within 6 weeks after your baby is born. Women who have hypertension during pregnancy have a greater chance of developing hypertension later in life or during future pregnancies. What are the causes? The exact cause of hypertension is not known. What increases the risk? There are certain factors that make it more likely for you to develop hypertension during pregnancy. These include:  Having hypertension during a previous pregnancy or prior to pregnancy.  Being overweight.  Being older than age 65.  Being pregnant for the first time or being pregnant with more than one baby.  Becoming pregnant using fertilization methods such as IVF (in vitro fertilization).  Having diabetes, kidney problems, or systemic lupus erythematosus.  Having a family history of hypertension.  What are the signs or symptoms? Chronic hypertension and gestational hypertension rarely cause symptoms. Preeclampsia causes symptoms, which  may include:  Increased protein in your urine. Your health care provider will check for this at every visit before you give birth (prenatal visit).  Severe headaches.  Sudden weight gain.  Swelling of the hands, face, legs, and feet.  Nausea and vomiting.  Vision problems, such as blurred or double vision.  Numbness in the face, arms, legs, and feet.  Dizziness.  Slurred speech.  Sensitivity to bright lights.  Abdominal pain.  Convulsions.  How is this diagnosed? You may be diagnosed with hypertension during a routine prenatal exam. At each prenatal visit, you may:  Have a urine test to check for high amounts of protein in your urine.  Have your blood pressure checked. A blood pressure reading is recorded as two numbers, such as "120 over 80" (or 120/80). The first ("top") number is called the systolic pressure. It is a measure of the pressure in your arteries when your heart beats. The second ("bottom") number is called the diastolic pressure. It is a measure of the pressure in your arteries as your heart relaxes between beats. Blood pressure is measured in a unit called mm Hg. A normal blood pressure reading is: ? Systolic: below 120. ? Diastolic: below 80.  The type of hypertension that you are diagnosed with depends on your test results and when your symptoms developed.  Chronic hypertension is usually diagnosed before 20 weeks of pregnancy.  Gestational hypertension is usually diagnosed after 20 weeks of pregnancy.  Hypertension with  Hypertension with high amounts of protein in the urine is diagnosed as preeclampsia. °· Blood pressure measurements that stay above 160 systolic, or above 110 diastolic, are signs of severe preeclampsia. ° °How is this treated? °Treatment for hypertension during pregnancy varies depending on the type of hypertension you have and how serious it is. °· If you take medicines called ACE inhibitors to treat chronic hypertension, you may need to switch medicines.  ACE inhibitors should not be taken during pregnancy. °· If you have gestational hypertension, you may need to take blood pressure medicine. °· If you are at risk for preeclampsia, your health care provider may recommend that you take a low-dose aspirin every day to prevent high blood pressure during your pregnancy. °· If you have severe preeclampsia, you may need to be hospitalized so you and your baby can be monitored closely. You may also need to take medicine (magnesium sulfate) to prevent seizures and to lower blood pressure. This medicine may be given as an injection or through an IV tube. °· In some cases, if your condition gets worse, you may need to deliver your baby early. ° °Follow these instructions at home: °Eating and drinking °· Drink enough fluid to keep your urine clear or pale yellow. °· Eat a healthy diet that is low in salt (sodium). Do not add salt to your food. Check food labels to see how much sodium a food or beverage contains. °Lifestyle °· Do not use any products that contain nicotine or tobacco, such as cigarettes and e-cigarettes. If you need help quitting, ask your health care provider. °· Do not use alcohol. °· Avoid caffeine. °· Avoid stress as much as possible. Rest and get plenty of sleep. °General instructions °· Take over-the-counter and prescription medicines only as told by your health care provider. °· While lying down, lie on your left side. This keeps pressure off your baby. °· While sitting or lying down, raise (elevate) your feet. Try putting some pillows under your lower legs. °· Exercise regularly. Ask your health care provider what kinds of exercise are best for you. °· Keep all prenatal and follow-up visits as told by your health care provider. This is important. °Contact a health care provider if: °· You have symptoms that your health care provider told you may require more treatment or monitoring, such as: °? Fever. °? Vomiting. °? Headache. °Get help right away  if: °· You have severe abdominal pain or vomiting that does not get better with treatment. °· You suddenly develop swelling in your hands, ankles, or face. °· You gain 4 lbs (1.8 kg) or more in 1 week. °· You develop vaginal bleeding, or you have blood in your urine. °· You do not feel your baby moving as much as usual. °· You have blurred or double vision. °· You have muscle twitching or sudden tightening (spasms). °· You have shortness of breath. °· Your lips or fingernails turn blue. °This information is not intended to replace advice given to you by your health care provider. Make sure you discuss any questions you have with your health care provider. °Document Released: 03/26/2011 Document Revised: 01/26/2016 Document Reviewed: 12/22/2015 °Elsevier Interactive Patient Education © 2018 Elsevier Inc. ° °

## 2017-04-16 NOTE — MAU Provider Note (Signed)
History     CSN: [id number]  Arrival date and time: 04/16/17 1324  First Provider Initiated Contact with Patient 04/16/17 2151      Chief Complaint  Patient presents with  . Contractions  . Hypertension   HPI Kynslie Ringle is a 21 y.o. G1P0 at [redacted]w[redacted]d with mono/di twins. Patient presented tonight for her 2nd dose of BMZ & reported to the nurse that her cramping was worse than last night. Patient was sent home with procardia PRN, which she has taken once today. Rates pain 4/10. Can't tell how frequent ctx are. Denies vaginal bleeding, LOF, dysuria, or n/v/d. Positive fetal movement.  Patient found to have elevated BPs here. Previously diagnosed with GHTN a few weeks ago. Denies headache, visual disturbance, or epigastric pain.    OB History    Gravida Para Term Preterm AB Living   1         0   SAB TAB Ectopic Multiple Live Births                  Past Medical History:  Diagnosis Date  . Asthma     Past Surgical History:  Procedure Laterality Date  . NO PAST SURGERIES      No family history on file.  Social History  Substance Use Topics  . Smoking status: Former Smoker    Quit date: 10/21/2016  . Smokeless tobacco: Never Used  . Alcohol use No    Allergies: No Known Allergies  Facility-Administered Medications Prior to Admission  Medication Dose Route Frequency Provider Last Rate Last Dose  . betamethasone acetate-betamethasone sodium phosphate (CELESTONE) injection 12 mg  12 mg Intramuscular Once Marylene Land, CNM       Prescriptions Prior to Admission  Medication Sig Dispense Refill Last Dose  . Elastic Bandages & Supports (COMFORT FIT MATERNITY SUPP LG) MISC 1 each by Does not apply route as needed. 1 each 0   . NIFEdipine (PROCARDIA) 10 MG capsule Take 1 capsule (10 mg total) by mouth as needed. 30 capsule 1   . ondansetron (ZOFRAN) 4 MG tablet Take 1 tablet (4 mg total) by mouth every 6 (six) hours. (Patient not taking: Reported on 02/19/2017) 12  tablet 0 Not Taking  . Prenatal Vit w/Fe-Methylfol-FA (PNV PO) Take by mouth.   Taking    Review of Systems  Constitutional: Negative.   Eyes: Negative for visual disturbance.  Respiratory: Negative for shortness of breath.   Cardiovascular: Negative for chest pain.  Gastrointestinal: Positive for abdominal pain. Negative for constipation, diarrhea, nausea and vomiting.  Genitourinary: Negative.   Neurological: Negative for headaches.   Physical Exam  Dilation: Closed Cervical Position: Posterior Exam by:: Alexx Gagliardo RN  Blood pressure (!) 130/100, pulse 90, last menstrual period 08/07/2016. Patient Vitals for the past 24 hrs:  BP Pulse  04/16/17 2215 126/77 93  04/16/17 2200 134/88 100  04/16/17 2130 (!) 130/100 90  04/16/17 2126 119/75 99  04/16/17 2124 (!) 144/95 (!) 101  04/16/17 2045 138/87 (!) 119    Physical Exam  Nursing note and vitals reviewed. Constitutional: She is oriented to person, place, and time. She appears well-developed and well-nourished. No distress.  HENT:  Head: Normocephalic and atraumatic.  Eyes: Conjunctivae are normal. Right eye exhibits no discharge. Left eye exhibits no discharge. No scleral icterus.  Neck: Normal range of motion.  Cardiovascular: Normal rate, regular rhythm and normal heart sounds.   No murmur heard. Respiratory: Effort normal and breath sounds normal. No  respiratory distress. She has no wheezes.  GI: Soft. There is no tenderness.  Neurological: She is alert and oriented to person, place, and time. She has normal reflexes.  Skin: Skin is warm and dry. She is not diaphoretic.  Psychiatric: She has a normal mood and affect. Her behavior is normal. Judgment and thought content normal.   Fetal Tracing: Baby A Baseline: 140 Variability: moderate Accelerations: 15x15 Decelerations: none  Baby B Baseline: 140 Variability: moderate Accelerations:15x15 Decelerations: none  Toco: irr ctx MAU Course   Procedures Results for orders placed or performed during the hospital encounter of 04/16/17 (from the past 24 hour(s))  Protein / creatinine ratio, urine     Status: None   Collection Time: 04/16/17  9:35 PM  Result Value Ref Range   Creatinine, Urine 198.00 mg/dL   Total Protein, Urine 17 mg/dL   Protein Creatinine Ratio 0.09 0.00 - 0.15 mg/mg[Cre]  CBC     Status: None   Collection Time: 04/16/17  9:43 PM  Result Value Ref Range   WBC 9.9 4.0 - 10.5 K/uL   RBC 4.42 3.87 - 5.11 MIL/uL   Hemoglobin 12.1 12.0 - 15.0 g/dL   HCT 54.0 98.1 - 19.1 %   MCV 81.7 78.0 - 100.0 fL   MCH 27.4 26.0 - 34.0 pg   MCHC 33.5 30.0 - 36.0 g/dL   RDW 47.8 29.5 - 62.1 %   Platelets 308 150 - 400 K/uL  Comprehensive metabolic panel     Status: Abnormal   Collection Time: 04/16/17  9:43 PM  Result Value Ref Range   Sodium 136 135 - 145 mmol/L   Potassium 3.7 3.5 - 5.1 mmol/L   Chloride 101 101 - 111 mmol/L   CO2 20 (L) 22 - 32 mmol/L   Glucose, Bld 92 65 - 99 mg/dL   BUN 8 6 - 20 mg/dL   Creatinine, Ser 3.08 0.44 - 1.00 mg/dL   Calcium 9.3 8.9 - 65.7 mg/dL   Total Protein 8.3 (H) 6.5 - 8.1 g/dL   Albumin 3.5 3.5 - 5.0 g/dL   AST 22 15 - 41 U/L   ALT 15 14 - 54 U/L   Alkaline Phosphatase 158 (H) 38 - 126 U/L   Total Bilirubin 0.4 0.3 - 1.2 mg/dL   GFR calc non Af Amer >60 >60 mL/min   GFR calc Af Amer >60 >60 mL/min   Anion gap 15 5 - 15    MDM Reactive NST x2.  Procardia 10 mg PO given -- pt reports resolution of contractions. Cervix closed.  Will cycle BPs while labs pending (CBC, CMP, urine PCR) No severe range BPs. Labs WNL. Patient missed yesterday's ob visit & next appt is 2 weeks out. Will have patient f/u in office on Friday for BP check & start antenatal testing for GHTN.  Assessment and Plan  A: 1. Gestational hypertension without significant proteinuria during pregnancy in third trimester, antepartum   2. Monochorionic diamniotic twin gestation in third trimester   3. Preterm  uterine contractions in third trimester, antepartum    P; Discharge home Strict return precautions for PTL or preeclampsia Urgent f/u appointment on Friday @ 10 am --- stressed importance of keeping appointments  Judeth Horn 04/16/2017, 9:50 PM

## 2017-04-17 ENCOUNTER — Telehealth: Payer: Self-pay | Admitting: *Deleted

## 2017-04-17 LAB — CULTURE, OB URINE

## 2017-04-17 NOTE — Telephone Encounter (Signed)
Received a call transferred from [employer] from a  Nurse Pam from [facility]. She is on a home visit with Beverly Roy and was calling to clarify if Beverly Roy's membranes had ruptured or not, and what was going on with her care and plan.  We clarifiied her membranes were not ruptured, she got BMZ twice and is supposed to be taking procardia as needed for uc's. She states she took the procardia earlier today for uc's and is ok with that. Her bp today 140/90, denies headaches, denies visual changes, and her edema is no different.  Discussed with Donette Larry, CNM and instructed patient to keep her appointment for bp tomorrow at 10am and is supposed to be get nst- I will send message to front office to add to schedule. Instructed her to go to mau if needed for headache, visual changes, worsening edema or any unusual concerns.

## 2017-04-18 ENCOUNTER — Ambulatory Visit (HOSPITAL_COMMUNITY)
Admission: RE | Admit: 2017-04-18 | Discharge: 2017-04-18 | Disposition: A | Discharge status: Home / Self Care | Payer: Medicaid Other | Source: Ambulatory Visit | Attending: Obstetrics & Gynecology | Admitting: Obstetrics & Gynecology

## 2017-04-18 ENCOUNTER — Ambulatory Visit (INDEPENDENT_AMBULATORY_CARE_PROVIDER_SITE_OTHER): Payer: Medicaid Other | Admitting: Obstetrics & Gynecology

## 2017-04-18 ENCOUNTER — Other Ambulatory Visit: Payer: Self-pay | Admitting: Obstetrics & Gynecology

## 2017-04-18 ENCOUNTER — Encounter (HOSPITAL_COMMUNITY): Payer: Self-pay

## 2017-04-18 VITALS — BP 121/87 | HR 76

## 2017-04-18 DIAGNOSIS — O358XX1 Maternal care for other (suspected) fetal abnormality and damage, fetus 1: Secondary | ICD-10-CM

## 2017-04-18 DIAGNOSIS — Z3A32 32 weeks gestation of pregnancy: Secondary | ICD-10-CM

## 2017-04-18 DIAGNOSIS — O358XX2 Maternal care for other (suspected) fetal abnormality and damage, fetus 2: Secondary | ICD-10-CM

## 2017-04-18 DIAGNOSIS — O0993 Supervision of high risk pregnancy, unspecified, third trimester: Secondary | ICD-10-CM

## 2017-04-18 DIAGNOSIS — O133 Gestational [pregnancy-induced] hypertension without significant proteinuria, third trimester: Secondary | ICD-10-CM | POA: Diagnosis present

## 2017-04-18 DIAGNOSIS — O321XX1 Maternal care for breech presentation, fetus 1: Secondary | ICD-10-CM | POA: Insufficient documentation

## 2017-04-18 DIAGNOSIS — O30033 Twin pregnancy, monochorionic/diamniotic, third trimester: Secondary | ICD-10-CM | POA: Diagnosis not present

## 2017-04-18 DIAGNOSIS — O0930 Supervision of pregnancy with insufficient antenatal care, unspecified trimester: Secondary | ICD-10-CM

## 2017-04-18 DIAGNOSIS — O35HXX2 Maternal care for other (suspected) fetal abnormality and damage, fetal lower extremities anomalies, fetus 2: Secondary | ICD-10-CM

## 2017-04-18 DIAGNOSIS — O35DXX1 Maternal care for other (suspected) fetal abnormality and damage, fetal gastrointestinal anomalies, fetus 1: Secondary | ICD-10-CM

## 2017-04-18 DIAGNOSIS — O358XX0 Maternal care for other (suspected) fetal abnormality and damage, not applicable or unspecified: Secondary | ICD-10-CM

## 2017-04-18 DIAGNOSIS — O0990 Supervision of high risk pregnancy, unspecified, unspecified trimester: Secondary | ICD-10-CM

## 2017-04-18 DIAGNOSIS — O0933 Supervision of pregnancy with insufficient antenatal care, third trimester: Secondary | ICD-10-CM

## 2017-04-18 HISTORY — DX: Essential (primary) hypertension: I10

## 2017-04-18 NOTE — Progress Notes (Signed)
Next visit will need flu shot 2 hour gtt tdap  28 weeks labs

## 2017-04-18 NOTE — Patient Instructions (Addendum)
Return to clinic for any scheduled appointments or obstetric concerns, or go to MAU for evaluation   AREA PEDIATRIC/FAMILY PRACTICE PHYSICIANS  [facility] [address], [address] [place], [place]  [address] Phone - [phone]   Fax - [phone]  [facility] [address] [place], [place] [address] Phone - [phone]   Fax - [phone]  [facility] [address] [place], [place]  [address] Phone - [phone]   Fax - [phone]  [facility] [address] [place], [place]  [address] Phone - [phone]   Fax - [phone]  [facility] [address] [place], [place]  [address] Phone - [phone]   Fax - [phone]  [facility] [address], [address] [place], [place]  [address] Phone - [phone]   Fax - [phone]  [facility] [address] [place], [place]  [address] Phone - [phone]   Fax - [phone]  [facility] [address], [address] [place], [place]  [address] Phone - [phone]   Fax - [phone]  [facility] [address] [place], [place]  [address] Phone - [phone]   Fax - [phone] [facility] [address] [place], [place]  [address] Phone - [phone]   Fax - [phone]  [facility] [address] [place], [place]  [address] Phone - [phone]   Fax - [phone]  [facility] [address] [place], [place]  [address] Phone - [phone]   Fax - [phone]  [facility] [address], [address] [place], [place]  [address] Phone - [phone]   Fax - [phone]  [facility] [address] [place], [place]  [address] Phone - [phone]  [facility] [address] [place], [place]  [address] Phone - [phone]   Fax - [phone]  [facility] [address] [place], [place]  [address] Phone - [phone]   Fax - [phone]  [facility] [address] [place], [place]  [address] Phone - [phone]   Fax - [phone]  [facility] [address] [place], [place]  [address] Phone - [phone]   Fax - [phone]   [facility] [address] [place], [place]  [address] Phone - [phone]   Fax - [phone]  [facility] [address] , [place]  [address] Phone - [phone]   Fax - [phone]  [facility] [address] , [place]  [address] Phone - [phone]   Fax - [phone]  [facility] [address] , [place]  [address] Phone - [phone]   Fax - [phone]  [facility] [address] , [place]  [address] Phone - [phone]   Fax - [phone] [facility] [address] [place], [place]  [address] Phone - [phone]   Fax - [phone]  [facility] [address] [place], [place]  [address] Phone - [phone]   Fax - [phone]  [facility] [address] [place],   [address] Phone - [phone]   Fax - [phone]  [facility] Charlene Flemming MD [address] [place]  [address] Phone [phone]    Fax [phone]   

## 2017-04-18 NOTE — Progress Notes (Signed)
   PRENATAL VISIT NOTE  Subjective:  Beverly Roy is a 21 y.o. G1P0 at [redacted]w[redacted]d being seen today for ongoing prenatal care.  She is currently monitored for the following issues for this high-risk pregnancy and has Supervision of high risk pregnancy, antepartum; Monochorionic diamniotic twin pregnancy; Club foot, fetal, affecting care of mother, antepartum; Gastroschisis, fetal, affecting care of mother, antepartum; Asthma; ADHD; and Gestational hypertension without significant proteinuria in third trimester on her problem list.  Patient reports occasional contractions. Has not been seen here since early August, just been seeing MFM. Thought they are her doctors.   Contractions: Irregular. Vag. Bleeding: None.  Movement: Present. Denies leaking of fluid.   The following portions of the patient's history were reviewed and updated as appropriate: allergies, current medications, past family history, past medical history, past social history, past surgical history and problem list. Problem list updated.  Objective:   Vitals:   04/18/17 1017  BP: 121/87  Pulse: 76    Fetal Status: Fetal Heart Rate (bpm): 145/140   Movement: Present     General:  Alert, oriented and cooperative. Patient is in no acute distress.  Skin: Skin is warm and dry. No rash noted.   Cardiovascular: Normal heart rate noted  Respiratory: Normal respiratory effort, no problems with respiration noted  Abdomen: Soft, gravid, appropriate for gestational age.  Pain/Pressure: Present     Pelvic: Cervical exam performed Dilation: Closed Effacement (%): Thick Station: Ballotable  Extremities: Normal range of motion.     Mental Status:  Normal mood and affect. Normal behavior. Normal judgment and thought content.   Assessment and Plan:  Pregnancy: G1P0 at [redacted]w[redacted]d  1. Insufficient antepartum care Emphasized importance of getting adequate prenatal care and doing routine prenatal studies.    2. Gestational hypertension without  significant proteinuria in third trimester 3. Monochorionic diamniotic twin gestation in third trimester Stable BP for now.  Antenatal testing to start today. Sent to MFM. - [place] MFM FETAL BPP WO NON STRESS; Future - [place] MFM FETAL BPP WO NST ADDL GESTATION; Future - [place] MFM FETAL BPP WO NST ADDL GESTATION  4. Club foot of fetus affecting antepartum care of mother, fetus 2 of multiple gestation 5. Fetal gastroschisis during pregnancy, antepartum, fetus 1 of multiple gestation Continue monitoring and scans as per MFM. Needs to follow up recommendations for delivery.  6. Supervision of high risk pregnancy, antepartum Preterm labor symptoms and general obstetric precautions including but not limited to vaginal bleeding, contractions, leaking of fluid and fetal movement were reviewed in detail with the patient. Please refer to After Visit Summary for other counseling recommendations.  Return in about 1 week (around 04/25/2017) for 2 hr GTT, 3rd trimester labs, TDap, Flu, OB Visit (HOB).   Jaynie Collins, MD

## 2017-04-18 NOTE — Procedures (Signed)
Beverly Roy 27-Dec-1995 43w0dBrianna Roy 15-Nov-1995 [redacted]w[redacted]d   Fetus B Non-Stress Test Interpretation for 04/18/17  Indication: Unsatisfactory BPP  Fetal Heart Rate Fetus B Mode: External Baseline Rate (B): 150 BPM Variability: Moderate Accelerations: 15 x 15 Decelerations: None  Uterine Activity Mode: Toco Contraction Frequency (min): none noted       Fetus A Non-Stress Test Interpretation for 04/18/17  Indication: Unsatisfactory BPP  Fetal Heart Rate A Mode: External Baseline Rate (A): 145 bpm Variability: Moderate Accelerations: 15 x 15 Decelerations: None  Uterine Activity Mode: Toco Contraction Frequency (min): none noted  Interpretation (Fetal Testing) Nonstress Test Interpretation: Reactive Comments: FHR tracing rev'd by Dr. Clarisa Fling

## 2017-04-29 ENCOUNTER — Ambulatory Visit (INDEPENDENT_AMBULATORY_CARE_PROVIDER_SITE_OTHER): Payer: Medicaid Other | Admitting: Family Medicine

## 2017-04-29 VITALS — BP 131/88 | HR 99

## 2017-04-29 DIAGNOSIS — O0990 Supervision of high risk pregnancy, unspecified, unspecified trimester: Secondary | ICD-10-CM

## 2017-04-29 DIAGNOSIS — Z23 Encounter for immunization: Secondary | ICD-10-CM

## 2017-04-29 DIAGNOSIS — O0993 Supervision of high risk pregnancy, unspecified, third trimester: Secondary | ICD-10-CM

## 2017-04-29 NOTE — Patient Instructions (Signed)

## 2017-04-29 NOTE — Progress Notes (Signed)
28 week labs/tdap and flu vaccine today

## 2017-04-29 NOTE — Progress Notes (Signed)
   PRENATAL VISIT NOTE  Subjective:  Beverly Roy is a 21 y.o. G1P0 at [redacted]w[redacted]d being seen today for ongoing prenatal care.  She is currently monitored for the following issues for this high-risk pregnancy and has Supervision of high risk pregnancy, antepartum; Monochorionic diamniotic twin pregnancy; Club foot, fetal, affecting care of mother, antepartum; Gastroschisis, fetal, affecting care of mother, antepartum; Asthma; ADHD; and Gestational hypertension without significant proteinuria in third trimester on her problem list.  Patient reports irregular contractions that come about twice daily.  Admits to occasional headache that resolves spontaneously without medication.  The following portions of the patient's history were reviewed and updated as appropriate: allergies, current medications, past family history, past medical history, past social history, past surgical history and problem list. Problem list updated.  Objective:   Vitals:   04/29/17 1258  BP: 131/88  Pulse: 99    Fetal Status: Fetal Heart Rate (bpm): 141/145 Fundal Height: 36 cm Movement: Present     General:  Alert, oriented and cooperative. Patient is in no acute distress.  Skin: Skin is warm and dry. No rash noted.   Cardiovascular: Normal heart rate noted  Respiratory: Normal respiratory effort, no problems with respiration noted  Abdomen: Soft, gravid, appropriate for gestational age.  Pain/Pressure: Present     Pelvic: Cervical exam deferred        Extremities: Normal range of motion.     Mental Status:  Normal mood and affect. Normal behavior. Normal judgment and thought content.   Assessment and Plan:  Pregnancy: G1P0 at [redacted]w[redacted]d  1. Need for immunization against influenza - Flu Vaccine QUAD 6+ mos IM (Fluarix)  2. Supervision of high risk pregnancy, antepartum, third trimester - Tdap vaccine greater than or equal to 7yo IM - CBC - HIV antibody (with reflex) - RPR - Glucose Tolerance, 1 Hour  3. Need  for diphtheria-tetanus-pertussis (Tdap) vaccine - Tdap vaccine greater than or equal to 7yo IM  4. Club foot of fetus affecting mother and Fetal gastroschisis during pregnancy -continue to follow with MFM. Follow up MFM recommendations for delivery.  Preterm labor symptoms and general obstetric precautions including but not limited to vaginal bleeding, contractions, leaking of fluid and fetal movement were reviewed in detail with the patient. Please refer to After Visit Summary for other counseling recommendations.  Return in about 2 weeks (around 05/13/2017).   Rolm Bookbinder, DO

## 2017-04-30 ENCOUNTER — Other Ambulatory Visit (HOSPITAL_COMMUNITY): Payer: Self-pay | Admitting: *Deleted

## 2017-04-30 ENCOUNTER — Ambulatory Visit (HOSPITAL_COMMUNITY)
Admission: RE | Admit: 2017-04-30 | Discharge: 2017-04-30 | Disposition: A | Discharge status: Home / Self Care | Payer: Medicaid Other | Source: Ambulatory Visit | Attending: Obstetrics & Gynecology | Admitting: Obstetrics & Gynecology

## 2017-04-30 ENCOUNTER — Other Ambulatory Visit (HOSPITAL_COMMUNITY): Payer: Self-pay | Admitting: Maternal and Fetal Medicine

## 2017-04-30 ENCOUNTER — Encounter (HOSPITAL_COMMUNITY): Payer: Self-pay

## 2017-04-30 VITALS — BP 133/84 | HR 101 | Wt 238.6 lb

## 2017-04-30 DIAGNOSIS — O358XX1 Maternal care for other (suspected) fetal abnormality and damage, fetus 1: Secondary | ICD-10-CM | POA: Insufficient documentation

## 2017-04-30 DIAGNOSIS — O30033 Twin pregnancy, monochorionic/diamniotic, third trimester: Secondary | ICD-10-CM

## 2017-04-30 DIAGNOSIS — Z3A33 33 weeks gestation of pregnancy: Secondary | ICD-10-CM | POA: Diagnosis not present

## 2017-04-30 DIAGNOSIS — O359XX0 Maternal care for (suspected) fetal abnormality and damage, unspecified, not applicable or unspecified: Secondary | ICD-10-CM

## 2017-04-30 DIAGNOSIS — O365931 Maternal care for other known or suspected poor fetal growth, third trimester, fetus 1: Secondary | ICD-10-CM | POA: Insufficient documentation

## 2017-04-30 DIAGNOSIS — O365932 Maternal care for other known or suspected poor fetal growth, third trimester, fetus 2: Secondary | ICD-10-CM | POA: Diagnosis not present

## 2017-04-30 DIAGNOSIS — O163 Unspecified maternal hypertension, third trimester: Secondary | ICD-10-CM

## 2017-04-30 DIAGNOSIS — O30039 Twin pregnancy, monochorionic/diamniotic, unspecified trimester: Secondary | ICD-10-CM

## 2017-04-30 DIAGNOSIS — IMO0002 Reserved for concepts with insufficient information to code with codable children: Secondary | ICD-10-CM

## 2017-04-30 DIAGNOSIS — O358XX2 Maternal care for other (suspected) fetal abnormality and damage, fetus 2: Secondary | ICD-10-CM | POA: Diagnosis not present

## 2017-04-30 LAB — GLUCOSE TOLERANCE, 1 HOUR: GLUCOSE, 1HR PP: 97 mg/dL (ref 65–199)

## 2017-04-30 LAB — CBC
HEMATOCRIT: 35.7 % (ref 34.0–46.6)
HEMOGLOBIN: 11.9 g/dL (ref 11.1–15.9)
MCH: 26 pg — AB (ref 26.6–33.0)
MCHC: 33.3 g/dL (ref 31.5–35.7)
MCV: 78 fL — AB (ref 79–97)
PLATELETS: 314 10*3/uL (ref 150–379)
RBC: 4.58 x10E6/uL (ref 3.77–5.28)
RDW: 14.6 % (ref 12.3–15.4)
WBC: 9.2 10*3/uL (ref 3.4–10.8)

## 2017-04-30 LAB — RPR: RPR Ser Ql: NONREACTIVE

## 2017-04-30 LAB — HIV ANTIBODY (ROUTINE TESTING W REFLEX): HIV Screen 4th Generation wRfx: NONREACTIVE

## 2017-05-07 ENCOUNTER — Ambulatory Visit (HOSPITAL_COMMUNITY)
Admission: RE | Admit: 2017-05-07 | Discharge: 2017-05-07 | Disposition: A | Discharge status: Home / Self Care | Payer: Medicaid Other | Source: Ambulatory Visit | Attending: Obstetrics & Gynecology | Admitting: Obstetrics & Gynecology

## 2017-05-07 ENCOUNTER — Other Ambulatory Visit (HOSPITAL_COMMUNITY): Payer: Self-pay | Admitting: Maternal and Fetal Medicine

## 2017-05-07 ENCOUNTER — Encounter (HOSPITAL_COMMUNITY): Payer: Self-pay

## 2017-05-07 DIAGNOSIS — O30033 Twin pregnancy, monochorionic/diamniotic, third trimester: Secondary | ICD-10-CM

## 2017-05-07 DIAGNOSIS — O133 Gestational [pregnancy-induced] hypertension without significant proteinuria, third trimester: Secondary | ICD-10-CM | POA: Diagnosis not present

## 2017-05-07 DIAGNOSIS — O358XX0 Maternal care for other (suspected) fetal abnormality and damage, not applicable or unspecified: Secondary | ICD-10-CM | POA: Diagnosis not present

## 2017-05-07 DIAGNOSIS — Z3A34 34 weeks gestation of pregnancy: Secondary | ICD-10-CM | POA: Insufficient documentation

## 2017-05-07 DIAGNOSIS — Q793 Gastroschisis: Secondary | ICD-10-CM

## 2017-05-07 DIAGNOSIS — IMO0002 Reserved for concepts with insufficient information to code with codable children: Secondary | ICD-10-CM

## 2017-05-07 DIAGNOSIS — Q6689 Other  specified congenital deformities of feet: Secondary | ICD-10-CM | POA: Insufficient documentation

## 2017-05-07 DIAGNOSIS — O321XX1 Maternal care for breech presentation, fetus 1: Secondary | ICD-10-CM | POA: Diagnosis not present

## 2017-05-07 DIAGNOSIS — O365932 Maternal care for other known or suspected poor fetal growth, third trimester, fetus 2: Secondary | ICD-10-CM | POA: Diagnosis present

## 2017-05-07 DIAGNOSIS — O0990 Supervision of high risk pregnancy, unspecified, unspecified trimester: Secondary | ICD-10-CM

## 2017-05-07 DIAGNOSIS — O365931 Maternal care for other known or suspected poor fetal growth, third trimester, fetus 1: Secondary | ICD-10-CM | POA: Insufficient documentation

## 2017-05-07 DIAGNOSIS — O30039 Twin pregnancy, monochorionic/diamniotic, unspecified trimester: Secondary | ICD-10-CM

## 2017-05-14 ENCOUNTER — Other Ambulatory Visit (HOSPITAL_COMMUNITY)
Admission: RE | Admit: 2017-05-14 | Discharge: 2017-05-14 | Disposition: A | Discharge status: Home / Self Care | Payer: Medicaid Other | Source: Ambulatory Visit | Attending: Obstetrics & Gynecology | Admitting: Obstetrics & Gynecology

## 2017-05-14 ENCOUNTER — Encounter (HOSPITAL_COMMUNITY): Payer: Self-pay

## 2017-05-14 ENCOUNTER — Ambulatory Visit (HOSPITAL_COMMUNITY)
Admission: RE | Admit: 2017-05-14 | Discharge: 2017-05-14 | Disposition: A | Discharge status: Home / Self Care | Payer: Medicaid Other | Source: Ambulatory Visit | Attending: Obstetrics & Gynecology | Admitting: Obstetrics & Gynecology

## 2017-05-14 ENCOUNTER — Ambulatory Visit (INDEPENDENT_AMBULATORY_CARE_PROVIDER_SITE_OTHER): Payer: Medicaid Other | Admitting: Obstetrics & Gynecology

## 2017-05-14 VITALS — BP 157/105 | HR 89 | Wt 246.1 lb

## 2017-05-14 DIAGNOSIS — O133 Gestational [pregnancy-induced] hypertension without significant proteinuria, third trimester: Secondary | ICD-10-CM | POA: Diagnosis not present

## 2017-05-14 DIAGNOSIS — Z3A35 35 weeks gestation of pregnancy: Secondary | ICD-10-CM | POA: Insufficient documentation

## 2017-05-14 DIAGNOSIS — O358XX1 Maternal care for other (suspected) fetal abnormality and damage, fetus 1: Secondary | ICD-10-CM | POA: Diagnosis not present

## 2017-05-14 DIAGNOSIS — O359XX0 Maternal care for (suspected) fetal abnormality and damage, unspecified, not applicable or unspecified: Secondary | ICD-10-CM | POA: Insufficient documentation

## 2017-05-14 DIAGNOSIS — O0993 Supervision of high risk pregnancy, unspecified, third trimester: Secondary | ICD-10-CM | POA: Diagnosis present

## 2017-05-14 DIAGNOSIS — O30039 Twin pregnancy, monochorionic/diamniotic, unspecified trimester: Secondary | ICD-10-CM

## 2017-05-14 DIAGNOSIS — O35DXX1 Maternal care for other (suspected) fetal abnormality and damage, fetal gastrointestinal anomalies, fetus 1: Secondary | ICD-10-CM

## 2017-05-14 DIAGNOSIS — IMO0002 Reserved for concepts with insufficient information to code with codable children: Secondary | ICD-10-CM

## 2017-05-14 DIAGNOSIS — O30033 Twin pregnancy, monochorionic/diamniotic, third trimester: Secondary | ICD-10-CM

## 2017-05-14 DIAGNOSIS — O321XX1 Maternal care for breech presentation, fetus 1: Secondary | ICD-10-CM | POA: Diagnosis not present

## 2017-05-14 DIAGNOSIS — O0990 Supervision of high risk pregnancy, unspecified, unspecified trimester: Secondary | ICD-10-CM

## 2017-05-14 LAB — POCT URINALYSIS DIP (DEVICE)
Bilirubin Urine: NEGATIVE
Glucose, UA: NEGATIVE mg/dL
Ketones, ur: NEGATIVE mg/dL
LEUKOCYTES UA: NEGATIVE
NITRITE: NEGATIVE
SPECIFIC GRAVITY, URINE: 1.025 (ref 1.005–1.030)
UROBILINOGEN UA: 0.2 mg/dL (ref 0.0–1.0)
pH: 6.5 (ref 5.0–8.0)

## 2017-05-14 NOTE — Progress Notes (Signed)
   PRENATAL VISIT NOTE  Subjective:  Beverly Roy is a 21 y.o. G1P0 at 4246w5d being seen today for ongoing prenatal care.  She is currently monitored for the following issues for this high-risk pregnancy and has Supervision of high risk pregnancy, antepartum; Monochorionic diamniotic twin pregnancy; Club foot, fetal, affecting care of mother, antepartum; Gastroschisis, fetal, affecting care of mother, antepartum; Asthma; ADHD; and Gestational hypertension without significant proteinuria in third trimester on her problem list.  Patient reports occasional contractions.  Contractions: Irregular. Vag. Bleeding: None.  Movement: Present. Denies leaking of fluid.   The following portions of the patient's history were reviewed and updated as appropriate: allergies, current medications, past family history, past medical history, past social history, past surgical history and problem list. Problem list updated.  Objective:   Vitals:   05/14/17 0918 05/14/17 0921  BP: (!) 157/100 (!) 157/105  Pulse: (!) 112 89  Weight: 246 lb 1.6 oz (111.6 kg)     Fetal Status: Fetal Heart Rate (bpm): 161/154   Movement: Present     General:  Alert, oriented and cooperative. Patient is in no acute distress.  Skin: Skin is warm and dry. No rash noted.   Cardiovascular: Normal heart rate noted  Respiratory: Normal respiratory effort, no problems with respiration noted  Abdomen: Soft, gravid, appropriate for gestational age.  Pain/Pressure: Present     Pelvic: Cervical exam performed        Extremities: Normal range of motion.  Edema: None  Mental Status:  Normal mood and affect. Normal behavior. Normal judgment and thought content.   Assessment and Plan:  Pregnancy: G1P0 at 5546w5d  1. Supervision of high risk pregnancy, antepartum  - Culture, beta strep (group b only) - GC/Chlamydia probe amp ([place]  2. Fetal gastroschisis during pregnancy, antepartum, fetus 1 of multiple  gestation [facility]  3. Monochorionic diamniotic twin gestation in third trimester   4. Gestational hypertension without significant proteinuria in third trimester Elevated BP noted and MFM was alerted as she has appointment there now, she may need further evaluation for possible preeclampsia  Preterm labor symptoms and general obstetric precautions including but not limited to vaginal bleeding, contractions, leaking of fluid and fetal movement were reviewed in detail with the patient. Please refer to After Visit Summary for other counseling recommendations.  Return in about 1 month (around 06/14/2017) for postpartum.   Scheryl DarterJames Janele Lague, MD

## 2017-05-14 NOTE — Patient Instructions (Signed)
Cesarean Delivery °Cesarean birth, or cesarean delivery, is the surgical delivery of a baby through an incision in the abdomen and the uterus. This may be referred to as a C-section. This procedure may be scheduled ahead of time, or it may be done in an emergency situation. °Tell a health care provider about: °· Any allergies you have. °· All medicines you are taking, including vitamins, herbs, eye drops, creams, and over-the-counter medicines. °· Any problems you or family members have had with anesthetic medicines. °· Any blood disorders you have. °· Any surgeries you have had. °· Any medical conditions you have. °· Whether you or any members of your family have a history of deep vein thrombosis (DVT) or pulmonary embolism (PE). °What are the risks? °Generally, this is a safe procedure. However, problems may occur, including: °· Infection. °· Bleeding. °· Allergic reactions to medicines. °· Damage to other structures or organs. °· Blood clots. °· Injury to your baby. ° °What happens before the procedure? °· Follow instructions from your health care provider about eating or drinking restrictions. °· Follow instructions from your health care provider about bathing before your procedure to help reduce your risk of infection. °· If you know that you are going to have a cesarean delivery, do not shave your pubic area. Shaving before the procedure may increase your risk of infection. °· Ask your health care provider about: °? Changing or stopping your regular medicines. This is especially important if you are taking diabetes medicines or blood thinners. °? Your pain management plan. This is especially important if you plan to breastfeed your baby. °? How long you will be in the hospital after the procedure. °? Any concerns you may have about receiving blood products if you need them during the procedure. °? Cord blood banking, if you plan to collect your baby’s umbilical cord blood. °· You may also want to ask your  health care provider: °? Whether you will be able to hold or breastfeed your baby while you are still in the operating room. °? Whether your baby can stay with you immediately after the procedure and during your recovery. °? Whether a family member or a person of your choice can go with you into the operating room and stay with you during the procedure, immediately after the procedure, and during your recovery. °· Plan to have someone drive you home when you are discharged from the hospital. °What happens during the procedure? °· Fetal monitors will be placed on your abdomen to monitor your heart rate and your baby's heart rate. °· Depending on the reason for your cesarean delivery, you may have a physical exam or additional testing, such as an ultrasound. °· An IV tube will be inserted into one of your veins. °· You may have your blood or urine tested. °· You will be given antibiotic medicine to help prevent infection. °· You may be given a special warming gown to wear to keep your temperature stable. °· Hair may be removed from your pubic area. °· The skin of your pubic area and lower abdomen will be cleaned with a germ-killing solution (antiseptic). °· A catheter may be inserted into your bladder through your urethra. This drains your urine during the procedure. °· You may be given one or more of the following: °? A medicine to numb the area (local anesthetic). °? A medicine to make you fall asleep (general anesthetic). °? A medicine (regional anesthetic) that is injected into your back or through a small   thin tube placed in your back (spinal anesthetic or epidural anesthetic). This numbs everything below the injection site and allows you to stay awake during your procedure. If this makes you feel nauseous, tell your health care provider. Medicines will be available to help reduce any nausea you may feel. °· An incision will be made in your abdomen, and then in your uterus. °· If you are awake during your  procedure, you may feel tugging and pulling in your abdomen, but you should not feel pain. If you feel pain, tell your health care provider immediately. °· Your baby will be removed from your uterus. You may feel more pressure or pushing while this happens. °· Immediately after birth, your baby will be dried and kept warm. You may be able to hold and breastfeed your baby. The umbilical cord may be clamped and cut during this time. °· Your placenta will be removed from your uterus. °· Your incisions will be closed with stitches (sutures). Staples, skin glue, or adhesive strips may also be applied to the incision in your abdomen. °· Bandages (dressings) will be placed over the incision in your abdomen. °The procedure may vary among health care providers and hospitals. °What happens after the procedure? °· Your blood pressure, heart rate, breathing rate, and blood oxygen level will be monitored often until the medicines you were given have worn off. °· You may continue to receive fluids and medicines through an IV tube. °· You will have some pain. Medicines will be available to help control your pain. °· To help prevent blood clots: °? You may be given medicines. °? You may have to wear compression stockings or devices. °? You will be encouraged to walk around when you are able. °· Hospital staff will encourage and support bonding with your baby. Your hospital may allow you and your baby to stay in the same room (rooming in) during your hospital stay to encourage successful breastfeeding. °· You may be encouraged to cough and breathe deeply often. This helps to prevent lung problems. °· If you have a catheter draining your urine, it will be removed as soon as possible after your procedure. °This information is not intended to replace advice given to you by your health care provider. Make sure you discuss any questions you have with your health care provider. °Document Released: 07/08/2005 Document Revised: 12/14/2015  Document Reviewed: 04/18/2015 °Elsevier Interactive Patient Education © 2017 Elsevier Inc. ° °

## 2017-05-15 LAB — GC/CHLAMYDIA PROBE AMP (~~LOC~~) NOT AT ARMC
Chlamydia: NEGATIVE
Neisseria Gonorrhea: NEGATIVE

## 2017-05-18 LAB — CULTURE, BETA STREP (GROUP B ONLY): Strep Gp B Culture: NEGATIVE

## 2017-05-22 ENCOUNTER — Encounter: Payer: Self-pay | Admitting: *Deleted

## 2017-05-23 ENCOUNTER — Ambulatory Visit: Payer: Medicaid Other

## 2017-06-11 ENCOUNTER — Ambulatory Visit: Payer: Medicaid Other | Admitting: Advanced Practice Midwife

## 2017-06-11 ENCOUNTER — Telehealth: Payer: Self-pay

## 2017-06-11 NOTE — Telephone Encounter (Signed)
LM for pt if she could please contact the office so that she can reschedule her missed her appt.

## 2017-06-26 ENCOUNTER — Ambulatory Visit (INDEPENDENT_AMBULATORY_CARE_PROVIDER_SITE_OTHER): Payer: Self-pay | Admitting: Student

## 2017-06-26 ENCOUNTER — Encounter: Payer: Self-pay | Admitting: Student

## 2017-06-26 DIAGNOSIS — O35DXX1 Maternal care for other (suspected) fetal abnormality and damage, fetal gastrointestinal anomalies, fetus 1: Secondary | ICD-10-CM

## 2017-06-26 DIAGNOSIS — O35HXX2 Maternal care for other (suspected) fetal abnormality and damage, fetal lower extremities anomalies, fetus 2: Secondary | ICD-10-CM

## 2017-06-26 DIAGNOSIS — O358XX1 Maternal care for other (suspected) fetal abnormality and damage, fetus 1: Secondary | ICD-10-CM

## 2017-06-26 DIAGNOSIS — O133 Gestational [pregnancy-induced] hypertension without significant proteinuria, third trimester: Secondary | ICD-10-CM

## 2017-06-26 DIAGNOSIS — O358XX2 Maternal care for other (suspected) fetal abnormality and damage, fetus 2: Secondary | ICD-10-CM

## 2017-06-26 LAB — POCT PREGNANCY, URINE: Preg Test, Ur: NEGATIVE

## 2017-06-26 NOTE — Progress Notes (Signed)
Subjective:     Beverly Roy is a 21 y.o. female who presents for a postpartum visit. She is 5 weeks postpartum following a low cervical transverse [facility] section [facility] due to fetal complications. I have fully reviewed the prenatal and intrapartum course. The delivery was at 35 gestational weeks. Outcome: primary cesarean section, low transverse incision. Anesthesia: spinal. Postpartum course has been uneventful but one child is still in the N[facility]. One baby has been in the NICU and one baby is home. Baby is feeding by bottle - Gerber smooth . Bleeding no bleeding. Bowel function is normal. Bladder function is normal. Patient is sexually active. Contraception method is none. Postpartum depression screening: negative.  Patient has had intercourse twice (yesterday and 10 days ago). Says she got her period two weeks ago.  Review of Systems Pertinent items are noted in HPI.   Objective:    BP 128/77   Pulse 90   Ht 5\' 3"  (1.6 m)   Wt 226 lb 1.6 oz (102.6 kg)   LMP 06/20/2017 (Exact Date)   Breastfeeding? No   BMI 40.05 kg/m   General:  alert, cooperative and no distress   Breasts:  inspection negative, no nipple discharge or bleeding, no masses or nodularity palpable  Lungs: clear to auscultation bilaterally  Heart:  regular rate and rhythm, S1, S2 normal, no murmur, click, rub or gallop  Abdomen: soft, non-tender; bowel sounds normal; no masses,  no organomegaly   Vulva:  not evaluated  Vagina: not evaluated  Cervix:  not evaluated  Corpus: not examined  Adnexa:  not evaluated  Rectal Exam: Not performed.        Assessment:    Healthy postpartum exam. Pap smear not done at today's visit as pap was negative in 2018. -Blood pressure well-controlled today.  -POCT negative today Plan:    1. Contraception: condoms 2. Patient unable to get Nexplanon today due to no medicaid coverage; [facility] and call us back for Nexplanon when  coverage is sorted out. Emphasized strongly the need to avoid intercourse or use a condom until Nexplanon is in place.  3. Follow up in: PRN weeks or as needed.    Luna KitchensKathryn Lachlan Mckim CNM

## 2017-06-26 NOTE — Progress Notes (Signed)
pp

## 2017-06-26 NOTE — Patient Instructions (Addendum)
**Use condoms or don't have intercourse until your Nexplanon is in your arm!**  Etonogestrel implant What is this medicine? ETONOGESTREL (et oh noe JES trel) is a contraceptive (birth control) device. It is used to prevent pregnancy. It can be used for up to 3 years. This medicine may be used for other purposes; ask your health care provider or pharmacist if you have questions. COMMON BRAND NAME(S): Implanon, Nexplanon What should I tell my health care provider before I take this medicine? They need to know if you have any of these conditions: -abnormal vaginal bleeding -blood vessel disease or blood clots -cancer of the breast, cervix, or liver -depression -diabetes -gallbladder disease -headaches -heart disease or recent heart attack -high blood pressure -high cholesterol -kidney disease -liver disease -renal disease -seizures -tobacco smoker -an unusual or allergic reaction to etonogestrel, other hormones, anesthetics or antiseptics, medicines, foods, dyes, or preservatives -pregnant or trying to get pregnant -breast-feeding How should I use this medicine? This device is inserted just under the skin on the inner side of your upper arm by a health care professional. Talk to your pediatrician regarding the use of this medicine in children. Special care may be needed. Overdosage: If you think you have taken too much of this medicine contact a poison control center or emergency room at once. NOTE: This medicine is only for you. Do not share this medicine with others. What if I miss a dose? This does not apply. What may interact with this medicine? Do not take this medicine with any of the following medications: -amprenavir -bosentan -fosamprenavir This medicine may also interact with the following medications: -barbiturate medicines for inducing sleep or treating seizures -certain medicines for fungal infections like ketoconazole and itraconazole -grapefruit  juice -griseofulvin -medicines to treat seizures like carbamazepine, felbamate, oxcarbazepine, phenytoin, topiramate -modafinil -phenylbutazone -rifampin -rufinamide -some medicines to treat HIV infection like atazanavir, indinavir, lopinavir, nelfinavir, tipranavir, ritonavir -St. John's wort This list may not describe all possible interactions. Give your health care provider a list of all the medicines, herbs, non-prescription drugs, or dietary supplements you use. Also tell them if you smoke, drink alcohol, or use illegal drugs. Some items may interact with your medicine. What should I watch for while using this medicine? This product does not protect you against HIV infection (AIDS) or other sexually transmitted diseases. You should be able to feel the implant by pressing your fingertips over the skin where it was inserted. Contact your doctor if you cannot feel the implant, and use a non-hormonal birth control method (such as condoms) until your doctor confirms that the implant is in place. If you feel that the implant may have broken or become bent while in your arm, contact your healthcare provider. What side effects may I notice from receiving this medicine? Side effects that you should report to your doctor or health care professional as soon as possible: -allergic reactions like skin rash, itching or hives, swelling of the face, lips, or tongue -breast lumps -changes in emotions or moods -depressed mood -heavy or prolonged menstrual bleeding -pain, irritation, swelling, or bruising at the insertion site -scar at site of insertion -signs of infection at the insertion site such as fever, and skin redness, pain or discharge -signs of pregnancy -signs and symptoms of a blood clot such as breathing problems; changes in vision; chest pain; severe, sudden headache; pain, swelling, warmth in the leg; trouble speaking; sudden numbness or weakness of the face, arm or leg -signs and symptoms  of liver  injury like dark yellow or brown urine; general ill feeling or flu-like symptoms; light-colored stools; loss of appetite; nausea; right upper belly pain; unusually weak or tired; yellowing of the eyes or skin -unusual vaginal bleeding, discharge -signs and symptoms of a stroke like changes in vision; confusion; trouble speaking or understanding; severe headaches; sudden numbness or weakness of the face, arm or leg; trouble walking; dizziness; loss of balance or coordination Side effects that usually do not require medical attention (report to your doctor or health care professional if they continue or are bothersome): -acne -back pain -breast pain -changes in weight -dizziness -general ill feeling or flu-like symptoms -headache -irregular menstrual bleeding -nausea -sore throat -vaginal irritation or inflammation This list may not describe all possible side effects. Call your doctor for medical advice about side effects. You may report side effects to FDA at 1-800-FDA-1088. Where should I keep my medicine? This drug is given in a hospital or clinic and will not be stored at home. NOTE: This sheet is a summary. It may not cover all possible information. If you have questions about this medicine, talk to your doctor, pharmacist, or health care provider.  2018 Elsevier/Gold Standard (2016-01-25 11:19:22)

## 2017-08-08 ENCOUNTER — Encounter: Payer: Self-pay | Admitting: Obstetrics & Gynecology

## 2017-08-08 ENCOUNTER — Ambulatory Visit (INDEPENDENT_AMBULATORY_CARE_PROVIDER_SITE_OTHER): Payer: Medicaid Other | Admitting: Obstetrics & Gynecology

## 2017-08-08 VITALS — BP 128/89 | HR 92 | Ht 63.0 in | Wt 236.0 lb

## 2017-08-08 DIAGNOSIS — Z3009 Encounter for other general counseling and advice on contraception: Secondary | ICD-10-CM

## 2017-08-08 DIAGNOSIS — Z3049 Encounter for surveillance of other contraceptives: Secondary | ICD-10-CM

## 2017-08-08 DIAGNOSIS — Z30017 Encounter for initial prescription of implantable subdermal contraceptive: Secondary | ICD-10-CM

## 2017-08-08 LAB — POCT PREGNANCY, URINE: Preg Test, Ur: NEGATIVE

## 2017-08-08 MED ORDER — ETONOGESTREL 68 MG ~~LOC~~ IMPL
68.0000 mg | DRUG_IMPLANT | Freq: Once | SUBCUTANEOUS | Status: AC
  Administered 2017-08-08: 68 mg via SUBCUTANEOUS

## 2017-08-08 NOTE — Progress Notes (Signed)
Pt is having some pain at incision.

## 2017-08-08 NOTE — Progress Notes (Signed)
Patient given informed consent, she signed consent form. Pregnancy test was negative.  Appropriate time out taken.  Patient's left arm was prepped and draped in the usual sterile fashion.. The ruler used to measure and mark insertion area.  Patient was prepped with alcohol swab and then injected with 5 ml of 1 % lidocaine.  She was prepped with betadine, Nexplanon removed from packaging,  Device confirmed in needle, then inserted full length of needle and withdrawn per handbook instructions.  There was minimal blood loss.  Patient insertion site covered with guaze and a pressure bandage to reduce any bruising.  The patient tolerated the procedure well and was given post procedure instructions. Return in about one month for Nexplanon check.  UPT: neg F/u in 1 year.   Marrion Accomando L. Harraway-Smith, M.D., Evern CoreFACOG

## 2017-08-08 NOTE — Patient Instructions (Signed)
Nexplanon Instructions After Insertion  Keep bandage clean and dry for 24 hours  May use ice/Tylenol/Ibuprofen for soreness or pain  If you develop fever, drainage or increased warmth from incision site-contact office immediately   

## 2017-08-19 ENCOUNTER — Encounter: Payer: Self-pay | Admitting: *Deleted

## 2017-12-16 ENCOUNTER — Encounter (HOSPITAL_COMMUNITY): Payer: Self-pay

## 2017-12-16 ENCOUNTER — Emergency Department (HOSPITAL_COMMUNITY): Payer: Medicaid Other

## 2017-12-16 ENCOUNTER — Emergency Department (HOSPITAL_COMMUNITY)
Admission: EM | Admit: 2017-12-16 | Discharge: 2017-12-16 | Disposition: A | Discharge status: Home / Self Care | Payer: Medicaid Other | Attending: Emergency Medicine | Admitting: Emergency Medicine

## 2017-12-16 ENCOUNTER — Other Ambulatory Visit: Payer: Self-pay

## 2017-12-16 DIAGNOSIS — Y929 Unspecified place or not applicable: Secondary | ICD-10-CM | POA: Insufficient documentation

## 2017-12-16 DIAGNOSIS — W2209XA Striking against other stationary object, initial encounter: Secondary | ICD-10-CM | POA: Insufficient documentation

## 2017-12-16 DIAGNOSIS — Y999 Unspecified external cause status: Secondary | ICD-10-CM | POA: Insufficient documentation

## 2017-12-16 DIAGNOSIS — S92535A Nondisplaced fracture of distal phalanx of left lesser toe(s), initial encounter for closed fracture: Secondary | ICD-10-CM | POA: Insufficient documentation

## 2017-12-16 DIAGNOSIS — J45909 Unspecified asthma, uncomplicated: Secondary | ICD-10-CM | POA: Diagnosis not present

## 2017-12-16 DIAGNOSIS — Z87891 Personal history of nicotine dependence: Secondary | ICD-10-CM | POA: Insufficient documentation

## 2017-12-16 DIAGNOSIS — Y9301 Activity, walking, marching and hiking: Secondary | ICD-10-CM | POA: Insufficient documentation

## 2017-12-16 DIAGNOSIS — S99922A Unspecified injury of left foot, initial encounter: Secondary | ICD-10-CM | POA: Diagnosis present

## 2017-12-16 DIAGNOSIS — I10 Essential (primary) hypertension: Secondary | ICD-10-CM | POA: Insufficient documentation

## 2017-12-16 MED ORDER — NAPROXEN 250 MG PO TABS
500.0000 mg | ORAL_TABLET | Freq: Once | ORAL | Status: AC
  Administered 2017-12-16: 500 mg via ORAL
  Filled 2017-12-16: qty 2

## 2017-12-16 MED ORDER — NAPROXEN 500 MG PO TABS: 500.0000 mg | ORAL_TABLET | Freq: Two times a day (BID) | ORAL | 0 refills | Status: DC

## 2017-12-16 NOTE — ED Provider Notes (Signed)
[facility] EMERGENCY DEPARTMENT Provider Note   CSN: [id number] Arrival date & time: 12/16/17  1752     History   Chief Complaint Chief Complaint  Patient presents with  . Foot Pain    HPI Beverly Roy is a 22 y.o. female who presents to the ED with left ankle/foot injury that occurred yesterday afternoon complaining of discomfort. She states she was walking, inverted the L ankle, and subsequently tried to restablize herself and stubbed the L fifth toe on some cement. She states she has had constant pain to the L 5th digit and to the diffuse ankle. Rates pain a 9/10 in severity, worse with ambulating. Took tylenol without significant relief. No fall or other injuries related to this incident. Denies numbness, weakness, tingling, or open wounds.   HPI  Past Medical History:  Diagnosis Date  . Asthma   . Hypertension     Patient Active Problem List   Diagnosis Date Noted  . Encounter for postpartum care and examination after delivery 06/26/2017  . Monochorionic diamniotic twin pregnancy 02/21/2017  . 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 Asthma 02/21/2017  . ADHD 02/21/2017    Past Surgical History:  Procedure Laterality Date  . NO PAST SURGERIES       OB History    Gravida  1   Para      Term      Preterm      AB      Living  0     SAB      TAB      Ectopic      Multiple      Live Births               Home Medications    Prior to Admission medications   Medication Sig Start Date End Date Taking? Authorizing Provider  Prenatal Vit w/Fe-Methylfol-FA (PNV PO) Take by mouth.    [provider]    Family History History reviewed. No pertinent family history.  Social History Social History   Tobacco Use  . Smoking status: Former Smoker    Last attempt to quit: 10/21/2016    Years since quitting: 1.1  . Smokeless tobacco: Never Used  Substance Use Topics  . Alcohol use: No    . Drug use: Yes    Types: Marijuana    Comment: quit with +UPT     Allergies   Patient has no known allergies.   Review of Systems Review of Systems  Constitutional: Negative for chills and fever.  Musculoskeletal: Positive for arthralgias (left ankle).       Positive for L 5th toe pain  Skin: Negative for wound.  Neurological: Negative for weakness and numbness.       Negative for paresthesias     Physical Exam Updated Vital Signs BP (!) 128/101 (BP Location: Right Arm)   Pulse 97   Temp 98.3 F (36.8 C) (Oral)   Resp 16   Ht  (1.6 m)   Wt 98.9 kg (218 lb)   SpO2 100%   BMI 38.62 kg/m   Physical Exam  Constitutional: She appears well-developed and well-nourished. No distress.  HENT:  Head: Normocephalic and atraumatic.  Eyes: Conjunctivae are normal. Right eye exhibits no discharge. Left eye exhibits no discharge.  Cardiovascular:  2+ symmetric DP pulses.   Musculoskeletal:  No obvious deformity, appreciable swelling, erythema, or ecchymosis. Patient has full ROM to bilateral ankles and knees. She  is able to move all toes. She is tender to palpation diffusely about the medial and lateral L ankle as well as the L 5th distal toe. No other focal tenderness. Navicular bone and base of the 5th are nontender.   Neurological: She is alert.  Clear speech. Sensation grossly intact to bilateral lower extremities. 5/5 strength with plantar/dorsiflexion bilaterally. Gait intact but antalgic  Skin: Capillary refill takes less than 2 seconds.  Psychiatric: She has a normal mood and affect. Her behavior is normal. Thought content normal.  Nursing note and vitals reviewed.   ED Treatments / Results  Labs (all labs ordered are listed, but only abnormal results are displayed) Labs Reviewed - No data to display  EKG None  Radiology Dg Ankle Complete Left  Result Date: 12/16/2017 CLINICAL DATA:  Left ankle and fifth toe pain after fall. EXAM: LEFT ANKLE COMPLETE - 3+  VIEW COMPARISON:  None. FINDINGS: There is no evidence of fracture, dislocation, or joint effusion. There is no evidence of arthropathy or other focal bone abnormality. Soft tissues are unremarkable. IMPRESSION: Negative. Electronically Signed   By: Gerome Sam III M.D   On: 12/16/2017 20:15   Dg Toe 5th Left  Result Date: 12/16/2017 CLINICAL DATA:  Left ankle and fifth toe pain after fall. EXAM: DG TOE 5TH LEFT COMPARISON:  None. FINDINGS: Suspected fracture through the distal fifth phalanx only seen on the lateral view. IMPRESSION: Suspected fracture through the distal fifth phalanx only seen on the lateral view. Electronically Signed   By: Gerome Sam III M.D   On: 12/16/2017 20:17    Procedures Procedures (including critical care time)  Medications Ordered in ED Medications  naproxen (NAPROSYN) tablet 500 mg (has no administration in time range)     Initial Impression / Assessment and Plan / ED Course  I have reviewed the triage vital signs and the nursing notes.  Pertinent labs & imaging results that were available during my care of the patient were reviewed by me and considered in my medical decision making (see chart for details).   Patient presents s/p L ankle/toe injury. X-rays obtained per triage- L ankle X-ray negative for fracture or dislocation, L 5th toe x-ray notable for suspected fracture through distal phalanx only visualized on lateral view. NVI distally. No open wounds. Nailbed is intact. Will buddy tape digits, place in cam walker, and DC home with Naproxen. I discussed results, treatment plan, need for PCP follow-up, and return precautions with the patient. Provided opportunity for questions, patient confirmed understanding and is in agreement with plan.   Final Clinical Impressions(s) / ED Diagnoses   Final diagnoses:  Closed nondisplaced fracture of distal phalanx of lesser toe of left foot, initial encounter    ED Discharge Orders        Ordered     naproxen (NAPROSYN) 500 MG tablet  2 times daily     12/16/17 2037       Cherly Anderson, PA-C 12/17/17 0112    Tegeler, Canary Brim, MD 12/17/17 [phone]

## 2017-12-16 NOTE — ED Triage Notes (Signed)
Pt presents with pain to L ankle after falling off porch yesterday.  Pt reports with twisting injury, she injured her L 5th toe, reports taking tylenol without relief.

## 2017-12-16 NOTE — Progress Notes (Signed)
Orthopedic Tech Progress Note Patient Details:  Beverly Roy 1995/09/27 [id number]  Ortho Devices Type of Ortho Device: CAM walker Ortho Device/Splint Location: LLE Ortho Device/Splint Interventions: Ordered   Post Interventions Patient Tolerated: Well Instructions Provided: Care of device   Jennye Moccasin 12/16/2017, 8:59 PM

## 2017-12-16 NOTE — Discharge Instructions (Signed)
Please read and follow all provided instructions.  You have been seen today for a left foot/ankle injury.   Tests performed today include: An x-ray of the affected area shows a fracture of your left pinky toe- we are taping your toes and placing you in a cam walker boot for comfort.  Vital signs. See below for your results today.   Home care instructions: -- *PRICE in the first 24-48 hours after injury: Protect (with brace, splint, sling), if given by your provider Rest Ice- Do not apply ice pack directly to your skin, place towel or similar between your skin and ice/ice pack. Apply ice for 20 min, then remove for 40 min while awake Compression- Wear brace, elastic bandage, splint as directed by your provider Elevate affected extremity above the level of your heart when not walking around for the first 24-48 hours   Medications:  Naproxen is a nonsteroidal anti-inflammatory medication that will help with pain and swelling. Be sure to take this medication as prescribed with food, 1 pill every 12 hours,  It should be taken with food, as it can cause stomach upset, and more seriously, stomach bleeding. Do not take other nonsteroidal anti-inflammatory medications with this such as Advil, Motrin, or Aleve.  We have prescribed you new medication(s) today. Discuss the medications prescribed today with your pharmacist as they can have adverse effects and interactions with your other medicines including over the counter and prescribed medications. Seek medical evaluation if you start to experience new or abnormal symptoms after taking one of these medicines, seek care immediately if you start to experience difficulty breathing, feeling of your throat closing, facial swelling, or rash as these could be indications of a more serious allergic reaction  Follow-up instructions: Please follow-up with your primary care provider or the provided orthopedic physician (bone specialist) if you continue to have  significant pain in 1 week. In this case you may have a more severe injury that requires further care.   Return instructions:  Please return if your toes or feet are numb or tingling, appear gray or blue, or you have severe pain (also elevate the leg and loosen splint or wrap if you were given one) Please return to the Emergency Department if you experience worsening symptoms.  Please return if you have any other emergent concerns. Additional Information:  Your vital signs today were: BP (!) 128/101 (BP Location: Right Arm)    Pulse 97    Temp 98.3 F (36.8 C) (Oral)    Resp 16    Ht  (1.6 m)    Wt 98.9 kg (218 lb)    SpO2 100%    BMI 38.62 kg/m  If your blood pressure (BP) was elevated above 135/85 this visit, please have this repeated by your doctor within one month. ---------------

## 2018-04-20 IMAGING — US US MFM OB LIMITED
1 series · 12 of 28 positions shown · non-contrast
Comparison: none

[Series 1: us mfm ob limited · 44 acquisitions, 12 frames shown]
[im 2/44]
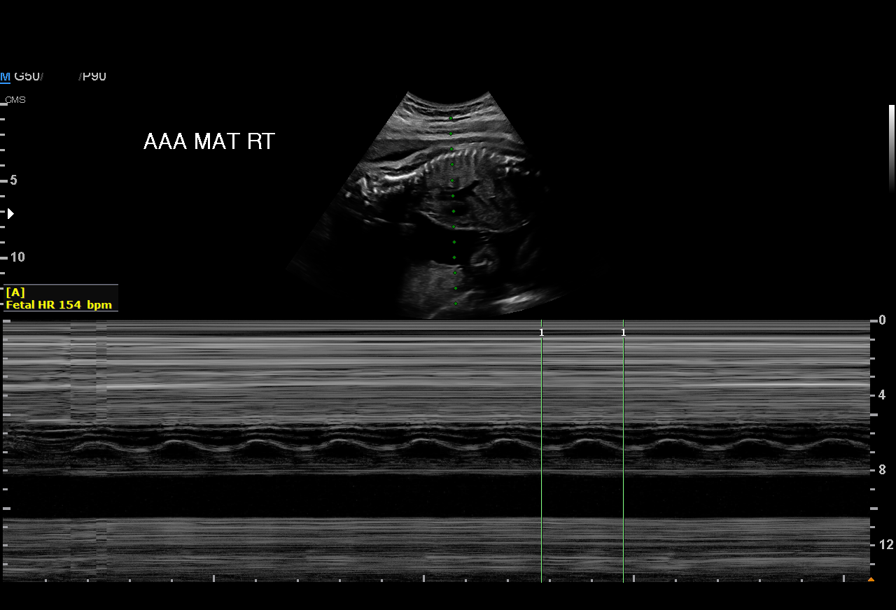
[im 5/44]
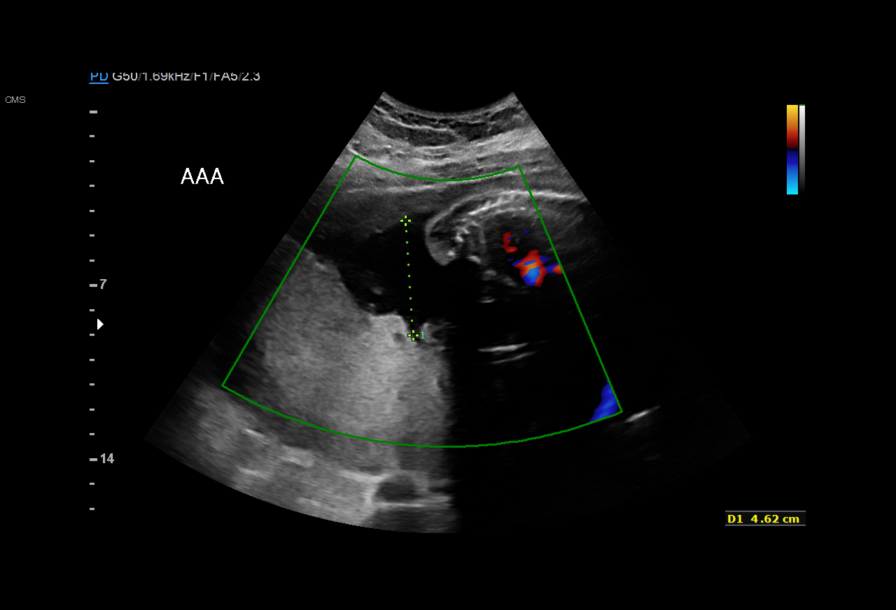
[im 8/44]
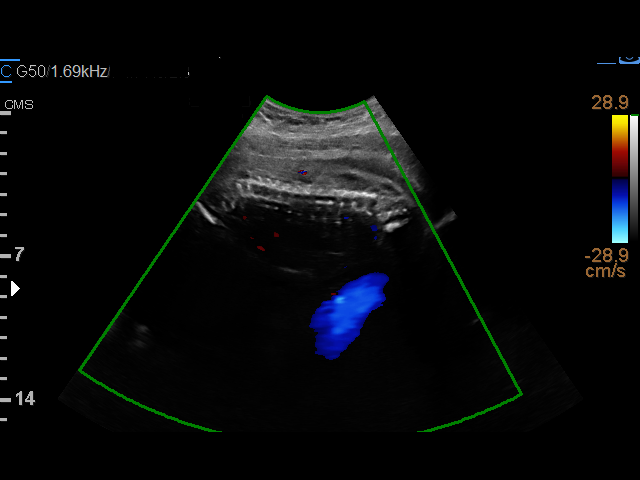
[im 13/44]
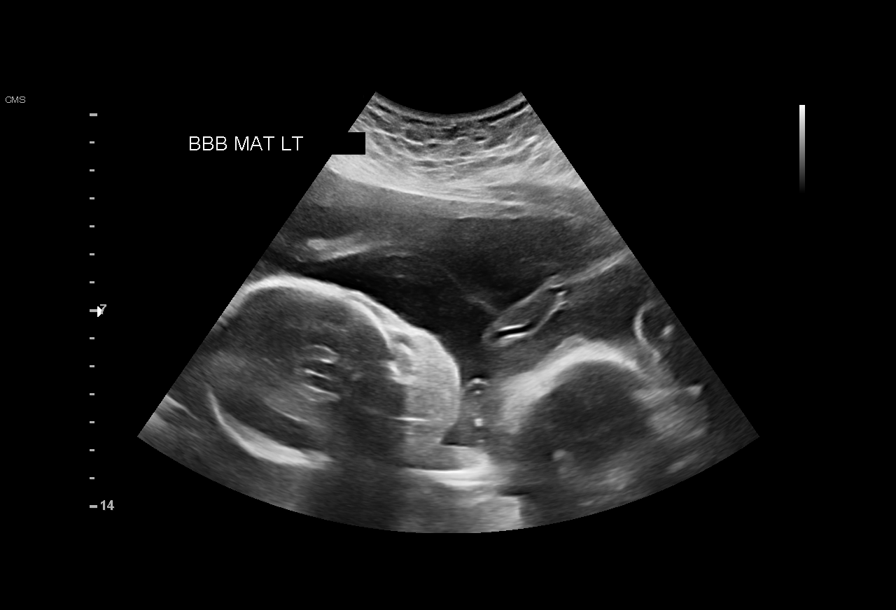
[im 16/44]
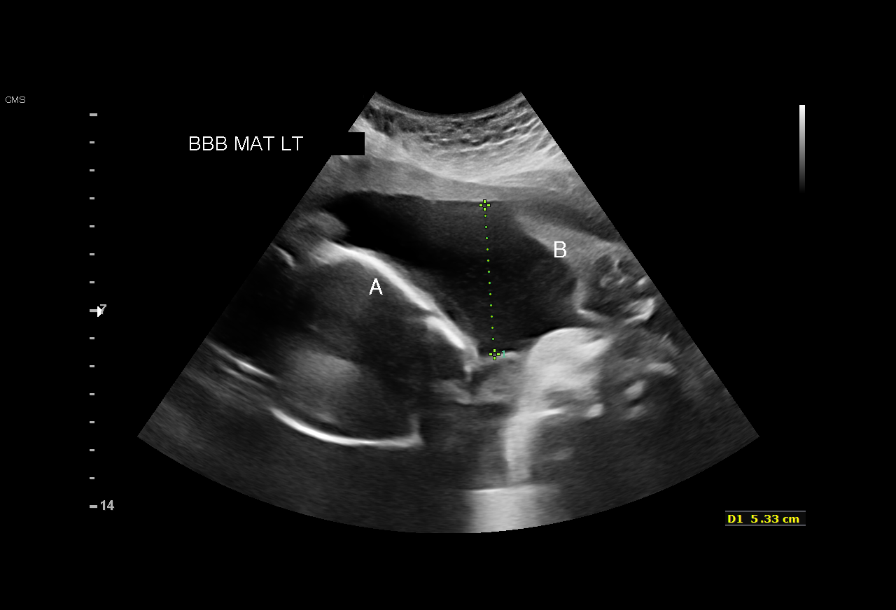
[im 20/44]
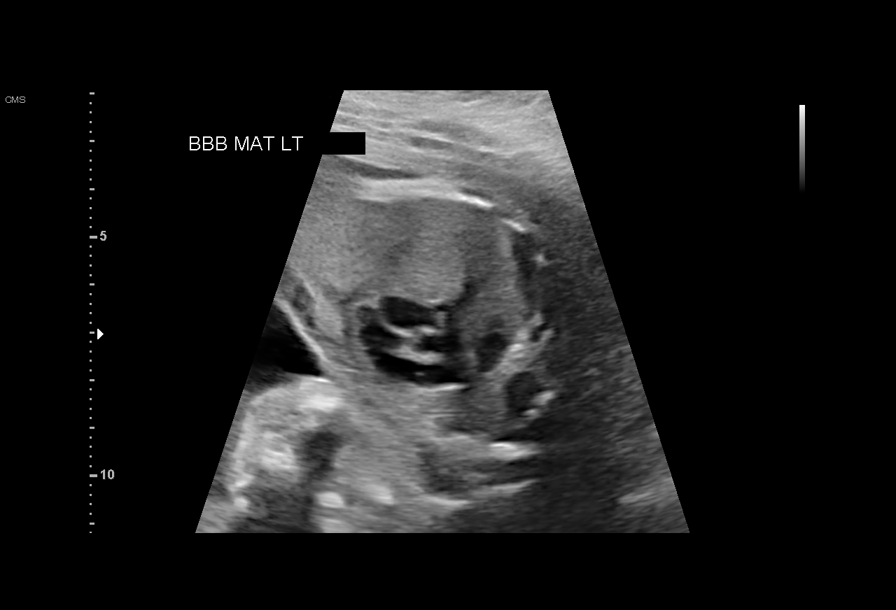
[im 24/44]
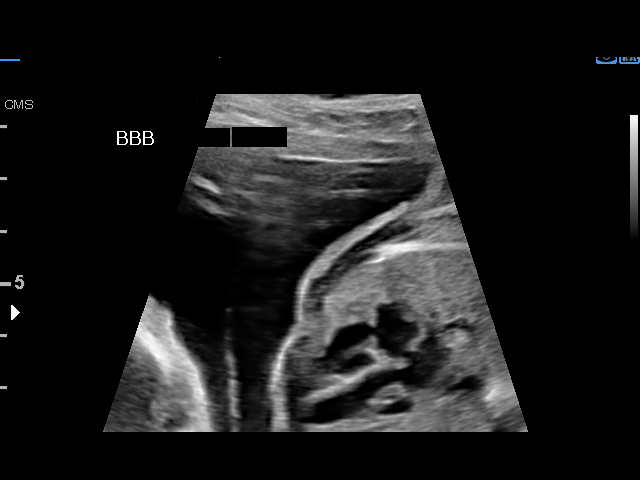
[im 28/44]
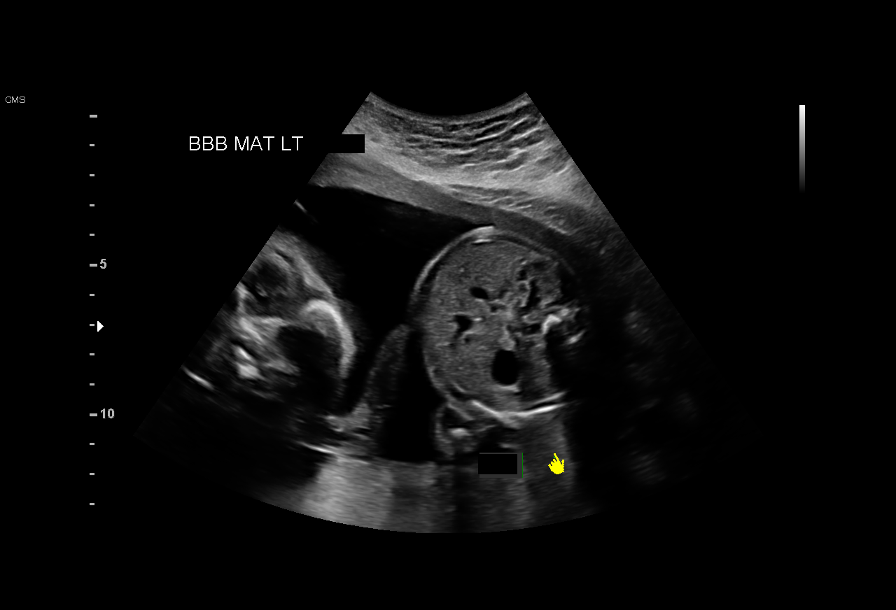
[im 31/44]
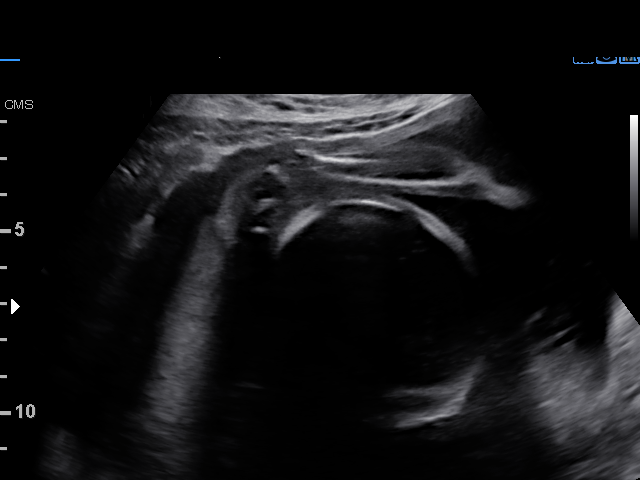
[im 36/44]
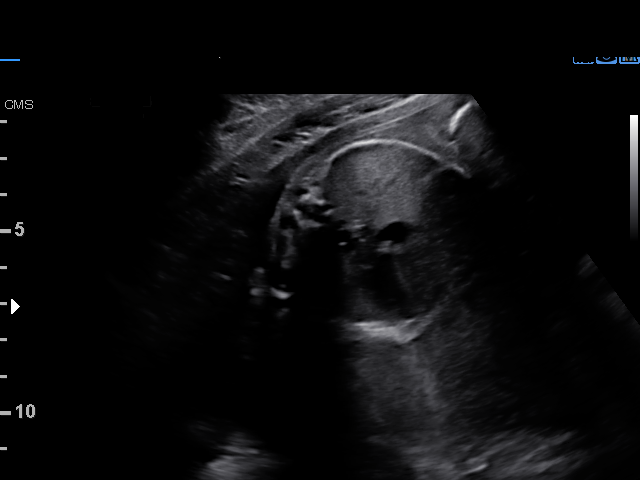
[im 39/44]
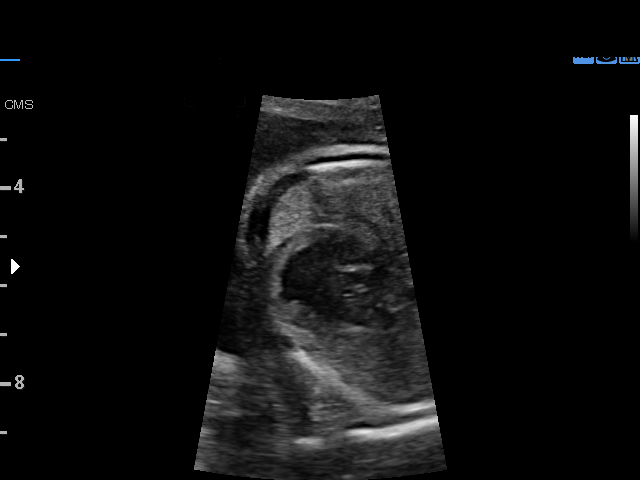
[im 42/44]
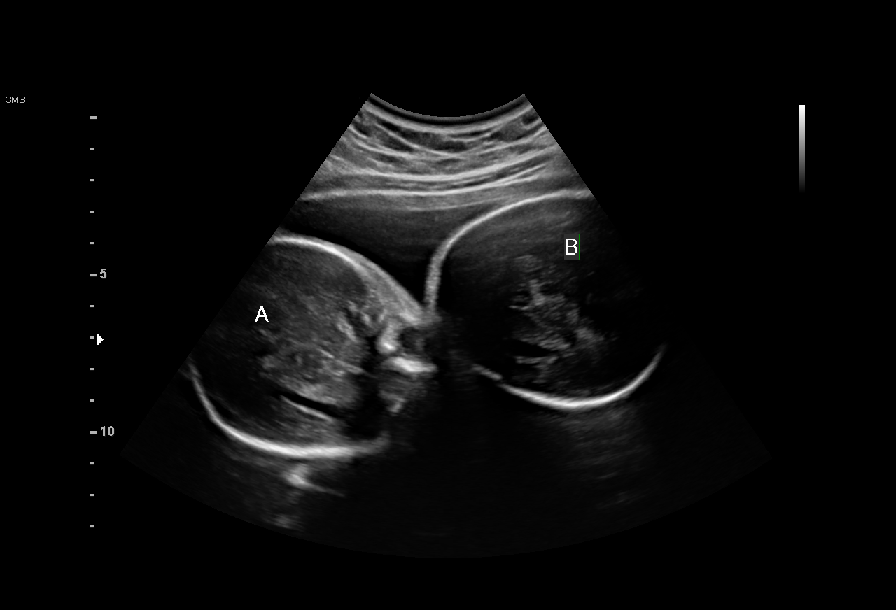

[12 of 28 positions shown; findings below may reference images not displayed]

OB/Gyn Clinic

1  BAKINSKIY TATLIM            [id number]      [id number]     [id number]
Indications

25 weeks gestation of pregnancy
Twin pregnancy, Amhie/Gengo, second trimester
Gastroschisis - Twin A
Club foot - Twin B
OB History

Gravidity:    1         Term:   0        Prem:   0        SAB:   0
TOP:          0       Ectopic:  0        Living: 0
Fetal Evaluation (Fetus A)

Num Of Fetuses:     2
Fetal Heart         154
Rate(bpm):
Cardiac Activity:   Observed
Fetal Lie:          Maternal right side
Presentation:       Breech
Placenta:           Posterior, above cervical os
P. Cord Insertion:  Previously Visualized

Amniotic Fluid
AFI FV:      Subjectively within normal limits

Largest Pocket(cm)
4.62
Gestational Age (Fetus A)

LMP:           30w 0d       Date:   08/07/16                 EDD:   05/14/17
Best:          25w 5d    Det. By:   Previous Ultrasound      EDD:   06/13/17
(01/13/17)

Fetal Evaluation (Fetus B)

Num Of Fetuses:     2
Fetal Heart         151
Rate(bpm):
Cardiac Activity:   Observed
Fetal Lie:          Maternal left side
Presentation:       Cephalic
Placenta:           Posterior, above cervical os
P. Cord Insertion:  Previously Visualized

Amniotic Fluid
AFI FV:      Subjectively within normal limits

Largest Pocket(cm)
5.33
Gestational Age (Fetus B)

LMP:           30w 0d       Date:   08/07/16                 EDD:   05/14/17
Best:          25w 5d    Det. By:   Previous Ultrasound      EDD:   06/13/17
(01/13/17)
Cervix Uterus Adnexa

Cervix
Length:            3.1  cm.
Normal appearance by transabdominal scan.
Impression

Monochorionic/diamniotic twin pregnancy at 25+5 weeks
Twin A: gastroschisis
Twin B: unilateral club foot
Normal amniotic fluid volume x 2
No evidence of TTTS
Recommendations

Follow-up ultrasound for growth in 2 weeks

## 2018-07-17 ENCOUNTER — Ambulatory Visit (HOSPITAL_COMMUNITY)
Admission: EM | Admit: 2018-07-17 | Discharge: 2018-07-17 | Disposition: A | Discharge status: Home / Self Care | Payer: Medicaid Other | Attending: Internal Medicine | Admitting: Internal Medicine

## 2018-07-17 ENCOUNTER — Encounter (HOSPITAL_COMMUNITY): Payer: Self-pay

## 2018-07-17 DIAGNOSIS — Z87891 Personal history of nicotine dependence: Secondary | ICD-10-CM | POA: Diagnosis not present

## 2018-07-17 DIAGNOSIS — Z3202 Encounter for pregnancy test, result negative: Secondary | ICD-10-CM

## 2018-07-17 DIAGNOSIS — Z791 Long term (current) use of non-steroidal anti-inflammatories (NSAID): Secondary | ICD-10-CM | POA: Diagnosis not present

## 2018-07-17 DIAGNOSIS — N760 Acute vaginitis: Secondary | ICD-10-CM | POA: Diagnosis not present

## 2018-07-17 DIAGNOSIS — J029 Acute pharyngitis, unspecified: Secondary | ICD-10-CM

## 2018-07-17 DIAGNOSIS — R05 Cough: Secondary | ICD-10-CM

## 2018-07-17 DIAGNOSIS — I10 Essential (primary) hypertension: Secondary | ICD-10-CM | POA: Insufficient documentation

## 2018-07-17 DIAGNOSIS — J069 Acute upper respiratory infection, unspecified: Secondary | ICD-10-CM

## 2018-07-17 LAB — POCT URINALYSIS DIP (DEVICE)
Bilirubin Urine: NEGATIVE
Glucose, UA: NEGATIVE mg/dL
Hgb urine dipstick: NEGATIVE
Ketones, ur: NEGATIVE mg/dL
Leukocytes, UA: NEGATIVE
Nitrite: NEGATIVE
PROTEIN: NEGATIVE mg/dL
Specific Gravity, Urine: 1.025 (ref 1.005–1.030)
UROBILINOGEN UA: 0.2 mg/dL (ref 0.0–1.0)
pH: 6 (ref 5.0–8.0)

## 2018-07-17 LAB — POCT RAPID STREP A: Streptococcus, Group A Screen (Direct): NEGATIVE

## 2018-07-17 LAB — POCT PREGNANCY, URINE: Preg Test, Ur: NEGATIVE

## 2018-07-17 MED ORDER — FLUCONAZOLE 150 MG PO TABS: 150.0000 mg | ORAL_TABLET | Freq: Once | ORAL | 0 refills | Status: AC

## 2018-07-17 NOTE — Discharge Instructions (Signed)
Symptoms today (sore throat, cough) seem most likely to be due to an upper respiratory infection, usually caused by a virus.  Strep test was negative at the urgent care today; a throat culture is pending.  The urgent care will contact you if additional treatment is needed based on results.  Cover your coughs/sneezes and wash hands frequently to decrease the spread of infection. For vaginal irritation, a vaginal swab test for common pelvic infections is pending.  Prescription for fluconazole (yeast medicine) was sent to the pharmacy.  The urgent care will contact you if further treatment is needed.

## 2018-07-17 NOTE — ED Provider Notes (Signed)
[facility]    CSN: [id number] Arrival date & time: 07/17/18  1343     History   Chief Complaint Chief Complaint  Patient presents with  . Sore Throat    HPI Beverly Roy is a 22 y.o. female.  Presents today with the onset of cough and sore throat, runny nose, last evening.  In the last year, she delivered twins at 34 weeks, 1 of them was born with external intestines and has been in/out of the hospital of [facility], currently admitted.  Staff advised her that she needed to have clearance that she was not contagious before she could visit her son in the hospital.  She is worried about strep.  No fever, no GI symptoms. Also want to test for yeast; having some vaginal irritation/discharge.  No pelvic pain.  No dysuria, denies risk of pregnancy because she is not having intercourse/not sexually active.  Not particularly worried about STDs.    HPI  Past Medical History:  Diagnosis Date  . Asthma   . Hypertension     Patient Active Problem List   Diagnosis Date Noted  . Encounter for postpartum care and examination after delivery 06/26/2017  . Monochorionic diamniotic twin pregnancy 02/21/2017  . 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 Asthma 02/21/2017  . ADHD 02/21/2017    Past Surgical History:  Procedure Laterality Date  . NO PAST SURGERIES         Home Medications    Prior to Admission medications   Medication Sig Start Date End Date Taking? Authorizing Provider  naproxen (NAPROSYN) 500 MG tablet Take 1 tablet (500 mg total) by mouth 2 (two) times daily. 12/16/17   Petrucelli, Samantha R, PA-C  Prenatal Vit w/Fe-Methylfol-FA (PNV PO) Take by mouth.    [provider]    Family History History reviewed. No pertinent family history.  Social History Social History   Tobacco Use  . Smoking status: Former Smoker    Last attempt to quit: 10/21/2016    Years since quitting: 1.7  . Smokeless tobacco: Never  Used  Substance Use Topics  . Alcohol use: No  . Drug use: Yes    Types: Marijuana    Comment: quit with +UPT     Allergies   Patient has no known allergies.   Review of Systems Review of Systems  All other systems reviewed and are negative.    Physical Exam Triage Vital Signs ED Triage Vitals  Enc Vitals Group     BP 07/17/18 1423 109/70     Pulse Rate 07/17/18 1423 (!) 103     Resp 07/17/18 1423 20     Temp 07/17/18 1423 98.1 F (36.7 C)     Temp Source 07/17/18 1423 Oral     SpO2 07/17/18 1423 100 %     Weight --      Height --      Pain Score 07/17/18 1425 7     Pain Loc --    Updated Vital Signs BP 109/70 (BP Location: Right Arm)   Pulse (!) 103   Temp 98.1 F (36.7 C) (Oral)   Resp 20   SpO2 100%  Physical Exam Vitals signs and nursing note reviewed.  Constitutional:      General: She is not in acute distress. HENT:     Head: Atraumatic.     Comments: Bilateral TMs are without erythema, left TM is moderately dull Moderate nasal congestion bilaterally Posterior pharynx is moderately injected  with prominent tonsils, no exudate Eyes:     Comments: Conjugate gaze observed, no eye redness/discharge  Neck:     Musculoskeletal: Neck supple.  Cardiovascular:     Rate and Rhythm: Normal rate and regular rhythm.  Pulmonary:     Effort: No respiratory distress.     Breath sounds: No wheezing or rales.     Comments: Lungs clear, symmetric breath sounds  Abdominal:     General: There is no distension.  Musculoskeletal: Normal range of motion.  Skin:    General: Skin is warm and dry.  Neurological:     Mental Status: She is alert and oriented to person, place, and time.      UC Treatments / Results  Labs Results for orders placed or performed during the hospital encounter of 07/17/18  Culture, group A strep  Result Value Ref Range   Specimen Description THROAT    Special Requests      NONE Performed at [facility], [address]  [place], [place] [address]    Culture NO GROUP A STREP (S.PYOGENES) ISOLATED    Report Status PENDING   Urine culture  Result Value Ref Range   Specimen Description URINE, RANDOM    Special Requests      NONE Performed at [facility], [address] [place], [place] [address]    Culture (A)     >=100,000 COLONIES/mL MULTIPLE SPECIES PRESENT, SUGGEST RECOLLECTION   Report Status 07/18/2018 FINAL   POCT rapid strep A [facility]  Result Value Ref Range   Streptococcus, Group A Screen (Direct) NEGATIVE NEGATIVE  POCT urinalysis dip (device)  Result Value Ref Range   Glucose, UA NEGATIVE NEGATIVE mg/dL   Bilirubin Urine NEGATIVE NEGATIVE   Ketones, ur NEGATIVE NEGATIVE mg/dL   Specific Gravity, Urine 1.025 1.005 - 1.030   Hgb urine dipstick NEGATIVE NEGATIVE   pH 6.0 5.0 - 8.0   Protein, ur NEGATIVE NEGATIVE mg/dL   Urobilinogen, UA 0.2 0.0 - 1.0 mg/dL   Nitrite NEGATIVE NEGATIVE   Leukocytes, UA NEGATIVE NEGATIVE  Pregnancy, urine POC  Result Value Ref Range   Preg Test, Ur NEGATIVE NEGATIVE    Final Clinical Impressions(s) / UC Diagnoses   Final diagnoses:  Acute upper respiratory infection  Vaginitis and vulvovaginitis     Discharge Instructions     Symptoms today (sore throat, cough) seem most likely to be due to an upper respiratory infection, usually caused by a virus.  Strep test was negative at the urgent care today; a throat culture is pending.  The urgent care will contact you if additional treatment is needed based on results.  Cover your coughs/sneezes and wash hands frequently to decrease the spread of infection. For vaginal irritation, a vaginal swab test for common pelvic infections is pending.  Prescription for fluconazole (yeast medicine) was sent to the pharmacy.  The urgent care will contact you if further treatment is needed.      ED Prescriptions    Medication Sig Dispense Auth. Provider   fluconazole (DIFLUCAN) 150 MG tablet Take 1 tablet  (150 mg total) by mouth once for 1 dose. Repeat dose in 3d if needed.  For yeast infection. 2 tablet Isa RankinMurray, Nahomy Limburg Wilson, MD       Isa RankinMurray, Teneka Malmberg Wilson, MD 07/19/18 1051

## 2018-07-17 NOTE — ED Triage Notes (Signed)
Pt presents with cough and sore throat 

## 2018-07-18 LAB — URINE CULTURE

## 2018-07-20 ENCOUNTER — Telehealth (HOSPITAL_COMMUNITY): Payer: Self-pay | Admitting: Emergency Medicine

## 2018-07-20 LAB — CERVICOVAGINAL ANCILLARY ONLY
Bacterial vaginitis: POSITIVE — AB
Candida vaginitis: NEGATIVE
Chlamydia: NEGATIVE
Neisseria Gonorrhea: NEGATIVE
TRICH (WINDOWPATH): NEGATIVE

## 2018-07-20 LAB — CULTURE, GROUP A STREP (THRC)

## 2018-07-20 MED ORDER — METRONIDAZOLE 500 MG PO TABS: 500.0000 mg | ORAL_TABLET | Freq: Two times a day (BID) | ORAL | 0 refills | Status: DC

## 2018-07-20 NOTE — Telephone Encounter (Signed)
Bacterial vaginosis is positive. This was not treated at the urgent care visit.  Flagyl 500 mg BID x 7 days #14 no refills sent to patients pharmacy of choice.   Patient contacted and made aware. All questions answered.   

## 2019-09-29 ENCOUNTER — Ambulatory Visit (HOSPITAL_COMMUNITY)
Admission: EM | Admit: 2019-09-29 | Discharge: 2019-09-29 | Disposition: A | Discharge status: Home / Self Care | Payer: Medicaid Other | Attending: Internal Medicine | Admitting: Internal Medicine

## 2019-09-29 ENCOUNTER — Other Ambulatory Visit: Payer: Self-pay

## 2019-09-29 ENCOUNTER — Encounter (HOSPITAL_COMMUNITY): Payer: Self-pay

## 2019-09-29 DIAGNOSIS — L03011 Cellulitis of right finger: Secondary | ICD-10-CM

## 2019-09-29 MED ORDER — ACETAMINOPHEN 500 MG PO TABS: 500.0000 mg | ORAL_TABLET | Freq: Four times a day (QID) | ORAL | 0 refills | Status: DC | PRN

## 2019-09-29 NOTE — ED Triage Notes (Signed)
Pt state she thinks a insect or spider bit her finger. Pt states this happened 2 days ago. ( right hand index finger swollen and sore)

## 2019-09-29 NOTE — ED Provider Notes (Addendum)
[facility]    CSN: [id number] Arrival date & time: 09/29/19  1626      History   Chief Complaint Chief Complaint  Patient presents with  . Insect Bite    HPI Beverly Roy is a 24 y.o. female with history of hypertension comes to the urgent care with right index finger swelling and pain which started 2 days ago.  Patient believes that she was bitten by a spider.  Pain is of moderate severity.  Pain is throbbing.  No known relieving factors.  Is aggravated by palpation and movement of her finger.   No fever or chills.  HPI  Past Medical History:  Diagnosis Date  . Asthma   . Hypertension     Patient Active Problem List   Diagnosis Date Noted  . Encounter for postpartum care and examination after delivery 06/26/2017  . Monochorionic diamniotic twin pregnancy 02/21/2017  . 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 Asthma 02/21/2017  . ADHD 02/21/2017    Past Surgical History:  Procedure Laterality Date  . NO PAST SURGERIES      OB History    Gravida  1   Para      Term      Preterm      AB      Living  0     SAB      TAB      Ectopic      Multiple      Live Births               Home Medications    Prior to Admission medications   Medication Sig Start Date End Date Taking? Authorizing Provider  acetaminophen (TYLENOL) 500 MG tablet Take 1 tablet (500 mg total) by mouth every 6 (six) hours as needed. 09/29/19   LampteyMyrene Galas, MD  Prenatal Vit w/Fe-Methylfol-FA (PNV PO) Take by mouth.    [provider]    Family History History reviewed. No pertinent family history.  Social History Social History   Tobacco Use  . Smoking status: Former Smoker    Quit date: 10/21/2016    Years since quitting: 2.9  . Smokeless tobacco: Never Used  Substance Use Topics  . Alcohol use: No  . Drug use: Yes    Types: Marijuana    Comment: quit with +UPT     Allergies   Patient has no known  allergies.   Review of Systems Review of Systems  Constitutional: Negative for chills, fatigue and fever.  Respiratory: Negative for cough and shortness of breath.   Musculoskeletal: Positive for arthralgias and joint swelling. Negative for myalgias.     Physical Exam Triage Vital Signs ED Triage Vitals  Enc Vitals Group     BP 09/29/19 1743 118/84     Pulse Rate 09/29/19 1743 100     Resp 09/29/19 1743 18     Temp 09/29/19 1743 99.2 F (37.3 C)     Temp Source 09/29/19 1743 Oral     SpO2 09/29/19 1743 100 %     Weight 09/29/19 1741 196 lb (88.9 kg)     Height --      Head Circumference --      Peak Flow --      Pain Score 09/29/19 1741 10     Pain Loc --      Pain Edu? --      Excl. in [place]? --    No data found.  Updated Vital Signs BP 118/84 (BP Location: Right Arm)   Pulse 100   Temp 99.2 F (37.3 C) (Oral)   Resp 18   Wt 88.9 kg   SpO2 100%   BMI 34.72 kg/m   Visual Acuity Right Eye Distance:   Left Eye Distance:   Bilateral Distance:    Right Eye Near:   Left Eye Near:    Bilateral Near:     Physical Exam Vitals and nursing note reviewed.  Constitutional:      General: She is in acute distress.  Skin:    General: Skin is warm.     Comments: Paronychia right index finger.  Neurological:     Mental Status: She is alert.      UC Treatments / Results  Labs (all labs ordered are listed, but only abnormal results are displayed) Labs Reviewed - No data to display  EKG   Radiology No results found.  Procedures Incision and Drainage  Date/Time: 09/29/2019 6:27 PM Performed by: Merrilee Jansky, MD Authorized by: Merrilee Jansky, MD   Consent:    Consent obtained:  Verbal   Consent given by:  Patient   Risks discussed:  Bleeding and incomplete drainage   Alternatives discussed:  No treatment and delayed treatment Location:    Type:  Abscess   Location:  Upper extremity   Upper extremity location:  Finger   Finger location:  R  index finger Pre-procedure details:    Skin preparation:  Betadine Anesthesia (see MAR for exact dosages):    Anesthesia method:  Local infiltration   Local anesthetic:  Lidocaine 2% w/o epi Procedure details:    Needle aspiration: no     Incision types:  Single straight   Incision depth:  Subcutaneous   Scalpel blade:  15   Wound management:  Probed and deloculated   Drainage:  Purulent   Drainage amount:  Scant   Wound treatment:  Wound left open   Packing materials:  None Post-procedure details:    Patient tolerance of procedure:  Tolerated well, no immediate complications   (including critical care time)  Medications Ordered in UC Medications - No data to display  Initial Impression / Assessment and Plan / UC Course  I have reviewed the triage vital signs and the nursing notes.  Pertinent labs & imaging results that were available during my care of the patient were reviewed by me and considered in my medical decision making (see chart for details).     1.  Paronychia involving the right index finger: Incision and drainage completed Continue warm salt water soaks If you notice any increased swelling, erythema or worsening pain return to urgent care to be reevaluated. Take Tylenol as needed for pain. Final Clinical Impressions(s) / UC Diagnoses   Final diagnoses:  Paronychia of right index finger   Discharge Instructions   None    ED Prescriptions    Medication Sig Dispense Auth. Provider   acetaminophen (TYLENOL) 500 MG tablet Take 1 tablet (500 mg total) by mouth every 6 (six) hours as needed. 30 tablet Shelda Truby, Britta Mccreedy, MD     PDMP not reviewed this encounter.   Merrilee Jansky, MD 09/29/19 Beverly Roy    Merrilee Jansky, MD 09/29/19 [phone]

## 2020-09-04 ENCOUNTER — Other Ambulatory Visit: Payer: Self-pay

## 2020-09-04 ENCOUNTER — Ambulatory Visit (HOSPITAL_COMMUNITY)
Admission: EM | Admit: 2020-09-04 | Discharge: 2020-09-04 | Disposition: A | Discharge status: Home / Self Care | Payer: Medicaid Other | Attending: Urgent Care | Admitting: Urgent Care

## 2020-09-04 ENCOUNTER — Encounter (HOSPITAL_COMMUNITY): Payer: Self-pay | Admitting: Emergency Medicine

## 2020-09-04 DIAGNOSIS — Z87891 Personal history of nicotine dependence: Secondary | ICD-10-CM | POA: Diagnosis not present

## 2020-09-04 DIAGNOSIS — Z20822 Contact with and (suspected) exposure to covid-19: Secondary | ICD-10-CM | POA: Diagnosis not present

## 2020-09-04 DIAGNOSIS — Z3201 Encounter for pregnancy test, result positive: Secondary | ICD-10-CM | POA: Diagnosis not present

## 2020-09-04 LAB — POC URINE PREG, ED: Preg Test, Ur: POSITIVE — AB

## 2020-09-04 NOTE — ED Provider Notes (Signed)
[facility]   MRN: [id number] DOB: February 11, 1996  Subjective:   Beverly Roy is a 25 y.o. female presenting for confirmation of pregnancy.  She reported to her previous OB and they recommended she come here for confirmation of her pregnancy test so that they could see her.  Patient had Nexplanon taken out in September 2021 due to infection.  She had a cycle right after then stopped after about 1 week. This is her last cycle.  Denies fever, nausea, vomiting, chest pain, shortness of breath, belly pain, pelvic pain, dysuria, vaginal discharge.  Denies history of blood clots.  She does have a history of asthma.  No current facility-administered medications for this encounter.  Current Outpatient Medications:  .  acetaminophen (TYLENOL) 500 MG tablet, Take 1 tablet (500 mg total) by mouth every 6 (six) hours as needed., Disp: 30 tablet, Rfl: 0 .  Prenatal Vit w/Fe-Methylfol-FA (PNV PO), Take by mouth., Disp: , Rfl:    No Known Allergies  Past Medical History:  Diagnosis Date  . Asthma   . Hypertension      Past Surgical History:  Procedure Laterality Date  . NO PAST SURGERIES      History reviewed. No pertinent family history.  Social History   Tobacco Use  . Smoking status: Former Smoker    Quit date: 10/21/2016    Years since quitting: 3.8  . Smokeless tobacco: Never Used  Substance Use Topics  . Alcohol use: No  . Drug use: Yes    Types: Marijuana    Comment: quit with +UPT    ROS   Objective:   Vitals: BP 128/83 (BP Location: Left Arm)   Pulse (!) 113   Temp 98.6 F (37 C) (Oral)   Resp 18   LMP  (LMP Unknown)   SpO2 99%   Pulse was 106-112 on recheck by PA-Mohamad Bruso.   LMP April 16, 2020.  Physical Exam Constitutional:      General: She is not in acute distress.    Appearance: Normal appearance. She is well-developed. She is not ill-appearing, toxic-appearing or diaphoretic.  HENT:     Head: Normocephalic and atraumatic.     Nose:  Nose normal.     Mouth/Throat:     Mouth: Mucous membranes are moist.     Pharynx: Oropharynx is clear.  Eyes:     General: No scleral icterus.    Extraocular Movements: Extraocular movements intact.     Pupils: Pupils are equal, round, and reactive to light.  Cardiovascular:     Rate and Rhythm: Regular rhythm. Tachycardia present.     Pulses: Normal pulses.     Heart sounds: Normal heart sounds. No murmur heard. No friction rub. No gallop.   Pulmonary:     Effort: Pulmonary effort is normal. No respiratory distress.     Breath sounds: Normal breath sounds. No stridor. No wheezing, rhonchi or rales.  Skin:    General: Skin is warm and dry.     Findings: No rash.  Neurological:     General: No focal deficit present.     Mental Status: She is alert and oriented to person, place, and time.  Psychiatric:        Mood and Affect: Mood normal.        Behavior: Behavior normal.        Thought Content: Thought content normal.     Results for orders placed or performed during the hospital encounter of 09/04/20 (from the  past 24 hour(s))  POC urine preg, ED (not at [facility])     Status: Abnormal   Collection Time: 09/04/20  2:14 PM  Result Value Ref Range   Preg Test, Ur POSITIVE (A) NEGATIVE    Assessment and Plan :   PDMP not reviewed this encounter.  1. Positive pregnancy test     Suspect tachycardia is related to pregnancy and/or her weight.  Very close follow-up with her obstetrician.  Currently patient is asymptomatic and she has a clear cardiopulmonary exam. General counseling provided including avoidance of alcohol and cigarettes, caffeine.  Counseled on medications generally safe in pregnancy. Counseled patient on potential for adverse effects with medications prescribed/recommended today, ER and return-to-clinic precautions discussed, patient verbalized understanding.    Wallis Bamberg, PA-C 09/04/20 1432

## 2020-09-04 NOTE — ED Triage Notes (Signed)
Pt presents for pregnancy test,. States has taken two positive pregnancy test at home 2 days ago.

## 2020-09-05 LAB — SARS CORONAVIRUS 2 (TAT 6-24 HRS): SARS Coronavirus 2: NEGATIVE

## 2020-10-12 DIAGNOSIS — O99012 Anemia complicating pregnancy, second trimester: Secondary | ICD-10-CM | POA: Diagnosis not present

## 2020-10-12 DIAGNOSIS — F5089 Other specified eating disorder: Secondary | ICD-10-CM | POA: Diagnosis not present

## 2020-10-12 DIAGNOSIS — O2602 Excessive weight gain in pregnancy, second trimester: Secondary | ICD-10-CM | POA: Diagnosis not present

## 2020-10-12 DIAGNOSIS — J45909 Unspecified asthma, uncomplicated: Secondary | ICD-10-CM | POA: Diagnosis not present

## 2020-10-12 DIAGNOSIS — Z23 Encounter for immunization: Secondary | ICD-10-CM | POA: Diagnosis not present

## 2020-10-12 DIAGNOSIS — Z3482 Encounter for supervision of other normal pregnancy, second trimester: Secondary | ICD-10-CM | POA: Diagnosis not present

## 2020-10-12 DIAGNOSIS — L83 Acanthosis nigricans: Secondary | ICD-10-CM | POA: Diagnosis not present

## 2020-10-12 DIAGNOSIS — O34211 Maternal care for low transverse scar from previous cesarean delivery: Secondary | ICD-10-CM | POA: Diagnosis not present

## 2020-10-12 DIAGNOSIS — O99019 Anemia complicating pregnancy, unspecified trimester: Secondary | ICD-10-CM | POA: Diagnosis not present

## 2020-10-12 DIAGNOSIS — O99810 Abnormal glucose complicating pregnancy: Secondary | ICD-10-CM | POA: Diagnosis not present

## 2020-10-12 DIAGNOSIS — E669 Obesity, unspecified: Secondary | ICD-10-CM | POA: Diagnosis not present

## 2020-10-12 DIAGNOSIS — Z8759 Personal history of other complications of pregnancy, childbirth and the puerperium: Secondary | ICD-10-CM | POA: Diagnosis not present

## 2020-10-12 LAB — OB RESULTS CONSOLE HGB/HCT, BLOOD
HCT: 34 (ref 29–41)
Hemoglobin: 11.1

## 2020-10-12 LAB — OB RESULTS CONSOLE GC/CHLAMYDIA
Chlamydia: NEGATIVE
Gonorrhea: NEGATIVE

## 2020-10-12 LAB — HEPATITIS B SURFACE ANTIGEN: Hep B Surface Antigen Quant: NONREACTIVE

## 2020-10-12 LAB — OB RESULTS CONSOLE ANTIBODY SCREEN: Antibody Screen: NEGATIVE

## 2020-10-12 LAB — OB RESULTS CONSOLE ABO/RH

## 2020-10-12 LAB — AMB REFERRAL TO OB-GYN
Glucose 3 Hour: 120
Glucose, 1 hour: 180

## 2020-10-12 LAB — OB RESULTS CONSOLE RPR: RPR: NONREACTIVE

## 2020-10-16 ENCOUNTER — Other Ambulatory Visit: Payer: Self-pay | Admitting: Family

## 2020-10-16 DIAGNOSIS — Z87891 Personal history of nicotine dependence: Secondary | ICD-10-CM | POA: Diagnosis not present

## 2020-10-16 DIAGNOSIS — Z8632 Personal history of gestational diabetes: Secondary | ICD-10-CM

## 2020-10-16 DIAGNOSIS — Z363 Encounter for antenatal screening for malformations: Secondary | ICD-10-CM

## 2020-10-16 DIAGNOSIS — O34211 Maternal care for low transverse scar from previous cesarean delivery: Secondary | ICD-10-CM | POA: Diagnosis not present

## 2020-10-16 DIAGNOSIS — O99012 Anemia complicating pregnancy, second trimester: Secondary | ICD-10-CM | POA: Diagnosis not present

## 2020-10-16 DIAGNOSIS — Z8751 Personal history of pre-term labor: Secondary | ICD-10-CM | POA: Diagnosis not present

## 2020-10-16 DIAGNOSIS — E669 Obesity, unspecified: Secondary | ICD-10-CM | POA: Diagnosis not present

## 2020-10-16 DIAGNOSIS — Z8659 Personal history of other mental and behavioral disorders: Secondary | ICD-10-CM | POA: Diagnosis not present

## 2020-10-16 DIAGNOSIS — N390 Urinary tract infection, site not specified: Secondary | ICD-10-CM | POA: Diagnosis not present

## 2020-10-16 DIAGNOSIS — J45909 Unspecified asthma, uncomplicated: Secondary | ICD-10-CM | POA: Diagnosis not present

## 2020-10-16 DIAGNOSIS — F909 Attention-deficit hyperactivity disorder, unspecified type: Secondary | ICD-10-CM | POA: Diagnosis not present

## 2020-10-16 DIAGNOSIS — Z3A29 29 weeks gestation of pregnancy: Secondary | ICD-10-CM

## 2020-10-16 DIAGNOSIS — Z3482 Encounter for supervision of other normal pregnancy, second trimester: Secondary | ICD-10-CM | POA: Diagnosis not present

## 2020-10-16 DIAGNOSIS — O24419 Gestational diabetes mellitus in pregnancy, unspecified control: Secondary | ICD-10-CM | POA: Diagnosis not present

## 2020-10-16 DIAGNOSIS — F5089 Other specified eating disorder: Secondary | ICD-10-CM | POA: Diagnosis not present

## 2020-10-16 DIAGNOSIS — Z8759 Personal history of other complications of pregnancy, childbirth and the puerperium: Secondary | ICD-10-CM

## 2020-10-19 ENCOUNTER — Other Ambulatory Visit: Payer: Self-pay

## 2020-10-19 ENCOUNTER — Encounter: Payer: Medicaid Other | Attending: Obstetrics & Gynecology | Admitting: Registered"

## 2020-10-19 ENCOUNTER — Other Ambulatory Visit: Payer: Self-pay | Admitting: *Deleted

## 2020-10-19 ENCOUNTER — Ambulatory Visit: Payer: Medicaid Other | Admitting: Registered"

## 2020-10-19 DIAGNOSIS — E119 Type 2 diabetes mellitus without complications: Secondary | ICD-10-CM | POA: Insufficient documentation

## 2020-10-19 DIAGNOSIS — O24419 Gestational diabetes mellitus in pregnancy, unspecified control: Secondary | ICD-10-CM | POA: Insufficient documentation

## 2020-10-19 DIAGNOSIS — Z3A00 Weeks of gestation of pregnancy not specified: Secondary | ICD-10-CM | POA: Diagnosis not present

## 2020-10-19 MED ORDER — ACCU-CHEK SOFTCLIX LANCETS MISC: 12 refills | Status: DC

## 2020-10-19 MED ORDER — GLUCOSE BLOOD VI STRP: ORAL_STRIP | 12 refills | Status: DC

## 2020-10-19 NOTE — Progress Notes (Signed)
Patient was seen on 10/19/20 for Gestational Diabetes self-management. EDD ? 01/26/21; [facility] transfer. Patient states she may have had GDM with her last year with a pregnancy (miscarriage). For diet history patient stated what she was told to eat at the [facility] department. Patient had questions about pizza and breadsticks.   B: scrambled eggs, 1 toast, low fat yogurt without sugar L: Salad with eggs S: cheese and peanuts D: meat, vegetables, 1 fruit, water watermellon for iron Beverages: diet soda, 6 bottles water  The following learning objectives were met by the patient :   States the definition of Gestational Diabetes  States why dietary management is important in controlling blood glucose  Describes the effects of carbohydrates on blood glucose levels  Demonstrates ability to create a balanced meal plan using food models  Demonstrates carbohydrate counting   States when to check blood glucose levels  Demonstrates proper blood glucose monitoring techniques  States the effect of stress and exercise on blood glucose levels  States the importance of limiting caffeine and abstaining from alcohol and smoking  Plan:  Aim for 3 Carbohydrate Choices per meal (45 grams) +/- 1 either way  Aim for 1-2 Carbohydrate Choices per snack Begin reading food labels for Total Carbohydrate of foods If OK with your MD, consider  increasing your activity level by walking, Arm Chair Exercises or other activity daily as tolerated Begin checking Blood Glucose before breakfast and 2 hours after first bite of breakfast, lunch and dinner as directed by MD  Bring Log Book/Sheet and meter to every medical appointment  Take medication if directed by MD  Blood glucose monitor given: Accu-chek Guide Me Lot #[id number] Exp: 09/10/2021 CBG: 162 mg/dL (patient states the only thing she has had to eat today is 5 grapes)  Rx order placed for  Accu-chek Guide strips and Softclix lancets  Patient instructed to  monitor glucose levels: FBS: 60 - 95 mg/dl 2 hour: <120 mg/dl  Patient received the following handouts:  Nutrition Diabetes and Pregnancy  Carbohydrate Counting List  Blood glucose Log Sheet  Patient will be seen for follow-up in 2 weeks or as needed.

## 2020-10-20 ENCOUNTER — Other Ambulatory Visit: Payer: Self-pay | Admitting: Lactation Services

## 2020-10-20 ENCOUNTER — Telehealth: Payer: Self-pay | Admitting: *Deleted

## 2020-10-20 MED ORDER — ACCU-CHEK SOFTCLIX LANCETS MISC: 12 refills | Status: DC

## 2020-10-20 MED ORDER — ACCU-CHEK GUIDE VI STRP: ORAL_STRIP | 12 refills | Status: DC

## 2020-10-20 NOTE — Telephone Encounter (Signed)
VM message left by [facility] @ [facility]. She asked for a call back to state pt's brand of glucose meter and freuqency for CBG testing

## 2020-10-20 NOTE — Telephone Encounter (Signed)
Accu Chek Lancets and strips reordered to match up with patients Accu Chek guide meter.   Called [facility] to inform them that new prescriptions have been sent.

## 2020-10-25 ENCOUNTER — Ambulatory Visit: Payer: Medicaid Other | Attending: Family

## 2020-10-30 ENCOUNTER — Encounter: Payer: Self-pay | Admitting: *Deleted

## 2020-11-01 ENCOUNTER — Other Ambulatory Visit: Payer: Self-pay

## 2020-11-01 ENCOUNTER — Encounter: Payer: Self-pay | Admitting: Obstetrics and Gynecology

## 2020-11-01 ENCOUNTER — Ambulatory Visit (INDEPENDENT_AMBULATORY_CARE_PROVIDER_SITE_OTHER): Payer: Medicaid Other | Admitting: Obstetrics and Gynecology

## 2020-11-01 VITALS — BP 123/76 | HR 100 | Wt 235.2 lb

## 2020-11-01 DIAGNOSIS — Z98891 History of uterine scar from previous surgery: Secondary | ICD-10-CM

## 2020-11-01 DIAGNOSIS — O24415 Gestational diabetes mellitus in pregnancy, controlled by oral hypoglycemic drugs: Secondary | ICD-10-CM | POA: Diagnosis not present

## 2020-11-01 DIAGNOSIS — O09299 Supervision of pregnancy with other poor reproductive or obstetric history, unspecified trimester: Secondary | ICD-10-CM | POA: Diagnosis not present

## 2020-11-01 DIAGNOSIS — O0990 Supervision of high risk pregnancy, unspecified, unspecified trimester: Secondary | ICD-10-CM | POA: Insufficient documentation

## 2020-11-01 DIAGNOSIS — Z23 Encounter for immunization: Secondary | ICD-10-CM | POA: Diagnosis not present

## 2020-11-01 HISTORY — DX: Supervision of pregnancy with other poor reproductive or obstetric history, unspecified trimester: O09.299

## 2020-11-01 LAB — AMB REFERRAL TO OB-GYN: Pap: NEGATIVE

## 2020-11-01 MED ORDER — METFORMIN HCL 500 MG PO TABS: 500.0000 mg | ORAL_TABLET | Freq: Two times a day (BID) | ORAL | 5 refills | Status: DC

## 2020-11-01 NOTE — Progress Notes (Signed)
Subjective:  Beverly Roy is a 25 y.o. G2P0 at [redacted]w[redacted]d being seen today for ongoing prenatal care. Transferred from [facility] d/t GDM. H/O GDM and GHTN in prior pregnancy, H/O twin gestation, C section, fetal anomaly in Twin A. EDD by LMP. Denies any chronic medications or problems. She is currently monitored for the following issues for this high-risk pregnancy and has Asthma; ADHD; Gestational diabetes mellitus (GDM), antepartum; Supervision of high risk pregnancy, antepartum; History of cesarean section; and History of fetal anomaly in prior pregnancy, currently pregnant on their problem list.  Patient reports no complaints.  Contractions: Irritability. Vag. Bleeding: None.  Movement: Present. Denies leaking of fluid.   The following portions of the patient's history were reviewed and updated as appropriate: allergies, current medications, past family history, past medical history, past social history, past surgical history and problem list. Problem list updated.  Objective:   Vitals:   11/01/20 1041  BP: 123/76  Pulse: 100  Weight: 235 lb 3.2 oz (106.7 kg)    Fetal Status: Fetal Heart Rate (bpm): 158   Movement: Present     General:  Alert, oriented and cooperative. Patient is in no acute distress.  Skin: Skin is warm and dry. No rash noted.   Cardiovascular: Normal heart rate noted  Respiratory: Normal respiratory effort, no problems with respiration noted  Abdomen: Soft, gravid, appropriate for gestational age. Pain/Pressure: Present     Pelvic:  Cervical exam deferred        Extremities: Normal range of motion.  Edema: Trace  Mental Status: Normal mood and affect. Normal behavior. Normal judgment and thought content.   Urinalysis:      Assessment and Plan:  Pregnancy: G2P0 at [redacted]w[redacted]d  1. Supervision of high risk pregnancy, antepartum Prenatal care and labs reviewed with pt. U/S ordered - CBC - HIV Antibody (routine testing w rflx) - RPR - Tdap vaccine greater than or equal to  7yo IM - [place] MFM OB DETAIL +14 WK; Future  2. Gestational diabetes mellitus (GDM) controlled on oral hypoglycemic drug, antepartum GDM and pregnancy reviewed with pt. Reduction of risks with glycemic control reviewed with pt. Has already seen DM educator CBG's not in goal range Will start Metformin. Indications for staring reviewed with pt U/S as noted above Antenatal testing starting at 32 weeks  - metFORMIN (GLUCOPHAGE) 500 MG tablet; Take 1 tablet (500 mg total) by mouth 2 (two) times daily with a meal.  Dispense: 60 tablet; Refill: 5 - [place] MFM OB DETAIL +14 WK; Future  3. History of cesarean section Desires repeat. Papers signed today  4. History of fetal anomaly in prior pregnancy, currently pregnant U/S as above  Preterm labor symptoms and general obstetric precautions including but not limited to vaginal bleeding, contractions, leaking of fluid and fetal movement were reviewed in detail with the patient. Please refer to After Visit Summary for other counseling recommendations.  No follow-ups on file.   Hermina Staggers, MD

## 2020-11-01 NOTE — Progress Notes (Signed)
Pt signed VBAC form 11/01/20

## 2020-11-01 NOTE — Patient Instructions (Signed)

## 2020-11-02 LAB — CBC
Hematocrit: 33.7 % — ABNORMAL LOW (ref 34.0–46.6)
Hemoglobin: 11.1 g/dL (ref 11.1–15.9)
MCH: 26.7 pg (ref 26.6–33.0)
MCHC: 32.9 g/dL (ref 31.5–35.7)
MCV: 81 fL (ref 79–97)
Platelets: 304 10*3/uL (ref 150–450)
RBC: 4.16 x10E6/uL (ref 3.77–5.28)
RDW: 13.4 % (ref 11.7–15.4)
WBC: 9.4 10*3/uL (ref 3.4–10.8)

## 2020-11-02 LAB — HIV ANTIBODY (ROUTINE TESTING W REFLEX): HIV Screen 4th Generation wRfx: NONREACTIVE

## 2020-11-02 LAB — RPR: RPR Ser Ql: NONREACTIVE

## 2020-11-10 ENCOUNTER — Encounter: Payer: Self-pay | Admitting: *Deleted

## 2020-11-10 ENCOUNTER — Ambulatory Visit: Payer: Medicaid Other | Admitting: *Deleted

## 2020-11-10 ENCOUNTER — Other Ambulatory Visit: Payer: Self-pay | Admitting: *Deleted

## 2020-11-10 ENCOUNTER — Telehealth: Payer: Self-pay | Admitting: *Deleted

## 2020-11-10 ENCOUNTER — Ambulatory Visit: Payer: Medicaid Other | Attending: Obstetrics and Gynecology

## 2020-11-10 ENCOUNTER — Other Ambulatory Visit: Payer: Self-pay

## 2020-11-10 ENCOUNTER — Ambulatory Visit (HOSPITAL_BASED_OUTPATIENT_CLINIC_OR_DEPARTMENT_OTHER): Payer: Medicaid Other | Admitting: Obstetrics

## 2020-11-10 DIAGNOSIS — O365920 Maternal care for other known or suspected poor fetal growth, second trimester, not applicable or unspecified: Secondary | ICD-10-CM | POA: Diagnosis not present

## 2020-11-10 DIAGNOSIS — O321XX0 Maternal care for breech presentation, not applicable or unspecified: Secondary | ICD-10-CM | POA: Diagnosis not present

## 2020-11-10 DIAGNOSIS — O99212 Obesity complicating pregnancy, second trimester: Secondary | ICD-10-CM | POA: Diagnosis not present

## 2020-11-10 DIAGNOSIS — O09292 Supervision of pregnancy with other poor reproductive or obstetric history, second trimester: Secondary | ICD-10-CM | POA: Diagnosis not present

## 2020-11-10 DIAGNOSIS — Z3A26 26 weeks gestation of pregnancy: Secondary | ICD-10-CM | POA: Diagnosis not present

## 2020-11-10 DIAGNOSIS — E669 Obesity, unspecified: Secondary | ICD-10-CM | POA: Insufficient documentation

## 2020-11-10 DIAGNOSIS — Z7984 Long term (current) use of oral hypoglycemic drugs: Secondary | ICD-10-CM | POA: Insufficient documentation

## 2020-11-10 DIAGNOSIS — O09212 Supervision of pregnancy with history of pre-term labor, second trimester: Secondary | ICD-10-CM

## 2020-11-10 DIAGNOSIS — O352XX0 Maternal care for (suspected) hereditary disease in fetus, not applicable or unspecified: Secondary | ICD-10-CM | POA: Diagnosis not present

## 2020-11-10 DIAGNOSIS — O24415 Gestational diabetes mellitus in pregnancy, controlled by oral hypoglycemic drugs: Secondary | ICD-10-CM

## 2020-11-10 DIAGNOSIS — O0932 Supervision of pregnancy with insufficient antenatal care, second trimester: Secondary | ICD-10-CM

## 2020-11-10 DIAGNOSIS — Z363 Encounter for antenatal screening for malformations: Secondary | ICD-10-CM | POA: Diagnosis not present

## 2020-11-10 DIAGNOSIS — Z8759 Personal history of other complications of pregnancy, childbirth and the puerperium: Secondary | ICD-10-CM | POA: Diagnosis not present

## 2020-11-10 DIAGNOSIS — O34219 Maternal care for unspecified type scar from previous cesarean delivery: Secondary | ICD-10-CM

## 2020-11-10 DIAGNOSIS — O359XX0 Maternal care for (suspected) fetal abnormality and damage, unspecified, not applicable or unspecified: Secondary | ICD-10-CM | POA: Diagnosis not present

## 2020-11-10 DIAGNOSIS — Z8632 Personal history of gestational diabetes: Secondary | ICD-10-CM

## 2020-11-10 DIAGNOSIS — Z3A29 29 weeks gestation of pregnancy: Secondary | ICD-10-CM

## 2020-11-10 DIAGNOSIS — Z3687 Encounter for antenatal screening for uncertain dates: Secondary | ICD-10-CM | POA: Diagnosis not present

## 2020-11-10 DIAGNOSIS — O24419 Gestational diabetes mellitus in pregnancy, unspecified control: Secondary | ICD-10-CM

## 2020-11-10 DIAGNOSIS — O0990 Supervision of high risk pregnancy, unspecified, unspecified trimester: Secondary | ICD-10-CM

## 2020-11-10 NOTE — Telephone Encounter (Addendum)
Pt left VM message stating that she believes she packed up her Diabetes kit and wants to know if she can get a replacement.   [place] pt and she stated she was @ office today and obtained supplies needed - lancets only. She had no further questions or problems.

## 2020-11-10 NOTE — Progress Notes (Signed)
MFM Note  Korryn Pancoast was seen for a detailed fetal anatomy scan due to maternal obesity and gestational diabetes that is currently treated with metformin.  The patient started prenatal care late in her current pregnancy.  Her prior pregnancy was complicated by a twin pregnancy where one fetus had a clubfoot.  She denies any other significant past medical history and denies any problems in her current pregnancy.    As she started prenatal care late in her current pregnancy, she has not had a screening test for fetal aneuploidy drawn.  The patient was offered and declined a screening test for fetal aneuploidy today.  Based on the fetal biometry measurements obtained today, her EDC was changed to February 16, 2021, making her 26 weeks and 0 days today.  There were no obvious fetal anomalies noted on today's ultrasound exam.  The patient was informed that anomalies may be missed due to technical limitations. If the fetus is in a suboptimal position or maternal habitus is increased, visualization of the fetus in the maternal uterus may be impaired.  The implications and management of diabetes in pregnancy was discussed in detail with the patient. She was advised that our goals for her fingerstick values are fasting values of 90-95 or less and two-hour postprandials of 120 or less.    Should her the majority of her fingerstick values be above these values, she may have to be started on insulin or her metformin dosage may have to be increased to help her achieve better glycemic control. The patient was advised that getting her fingerstick values as close to these goals as possible would provide her with the most optimal obstetrical outcome.  Due to maternal obesity and gestational diabetes, we will continue to follow her with monthly growth ultrasounds.  Weekly fetal testing should be started at around 32 weeks.    A follow-up exam was scheduled in 4 weeks to confirm her dates and to assess the fetal  growth.  A total of 30 minutes was spent counseling and coordinating the care for this patient.  Greater than 50% of the time was spent in direct face-to-face contact.

## 2020-11-15 ENCOUNTER — Encounter: Payer: Self-pay | Admitting: Registered"

## 2020-11-15 NOTE — Progress Notes (Signed)
Provided [employer] Me, Lot [id number], Exp: 11/24/2021

## 2020-11-29 ENCOUNTER — Other Ambulatory Visit: Payer: Self-pay

## 2020-11-29 ENCOUNTER — Ambulatory Visit (INDEPENDENT_AMBULATORY_CARE_PROVIDER_SITE_OTHER): Payer: Medicaid Other | Admitting: Obstetrics & Gynecology

## 2020-11-29 VITALS — BP 123/71 | HR 110 | Wt 236.9 lb

## 2020-11-29 DIAGNOSIS — Z98891 History of uterine scar from previous surgery: Secondary | ICD-10-CM

## 2020-11-29 DIAGNOSIS — O0990 Supervision of high risk pregnancy, unspecified, unspecified trimester: Secondary | ICD-10-CM | POA: Diagnosis not present

## 2020-11-29 DIAGNOSIS — O24415 Gestational diabetes mellitus in pregnancy, controlled by oral hypoglycemic drugs: Secondary | ICD-10-CM

## 2020-11-29 LAB — POCT URINALYSIS DIP (DEVICE)
Bilirubin Urine: NEGATIVE
Glucose, UA: NEGATIVE mg/dL
Hgb urine dipstick: NEGATIVE
Ketones, ur: NEGATIVE mg/dL
Leukocytes,Ua: NEGATIVE
Nitrite: NEGATIVE
Protein, ur: NEGATIVE mg/dL
Specific Gravity, Urine: 1.02 (ref 1.005–1.030)
Urobilinogen, UA: 0.2 mg/dL (ref 0.0–1.0)
pH: 6 (ref 5.0–8.0)

## 2020-11-29 MED ORDER — BLOOD PRESSURE KIT DEVI: 1.0000 | 0 refills | Status: DC | PRN

## 2020-11-29 NOTE — Patient Instructions (Signed)

## 2020-11-29 NOTE — Progress Notes (Signed)
   PRENATAL VISIT NOTE  Subjective:  Beverly Roy is a 25 y.o. G2P0102 at 39w5dbeing seen today for ongoing prenatal care.  She is currently monitored for the following issues for this high-risk pregnancy and has Asthma; ADHD; Gestational diabetes mellitus (GDM), antepartum; Supervision of high risk pregnancy, antepartum; History of cesarean section; and History of fetal anomaly in prior pregnancy, currently pregnant on their problem list.  Patient reports no complaints.  Contractions: Irritability. Vag. Bleeding: None.  Movement: Present. Denies leaking of fluid.   The following portions of the patient's history were reviewed and updated as appropriate: allergies, current medications, past family history, past medical history, past social history, past surgical history and problem list.   Objective:   Vitals:   11/29/20 1316  BP: 123/71  Pulse: (!) 110  Weight: 236 lb 14.4 oz (107.5 kg)    Fetal Status: Fetal Heart Rate (bpm): 153   Movement: Present     General:  Alert, oriented and cooperative. Patient is in no acute distress.  Skin: Skin is warm and dry. No rash noted.   Cardiovascular: Normal heart rate noted  Respiratory: Normal respiratory effort, no problems with respiration noted  Abdomen: Soft, gravid, appropriate for gestational age.  Pain/Pressure: Present     Pelvic: Cervical exam deferred        Extremities: Normal range of motion.  Edema: Trace  Mental Status: Normal mood and affect. Normal behavior. Normal judgment and thought content.   Assessment and Plan:  Pregnancy: [id number] 297w5d. Supervision of high risk pregnancy, antepartum Missing some results from HD - Blood Pressure Monitoring (BLOOD PRESSURE KIT) DEVI; 1 Device by Does not apply route as needed.  Dispense: 1 each; Refill: 0 - Hepatitis B surface antigen - Hepatitis C Antibody  2. History of cesarean section Plans repeat  3. Gestational diabetes mellitus (GDM) controlled on oral hypoglycemic  drug, antepartum FBS 95, pp 120-130, continue current mgmt  Preterm labor symptoms and general obstetric precautions including but not limited to vaginal bleeding, contractions, leaking of fluid and fetal movement were reviewed in detail with the patient. Please refer to After Visit Summary for other counseling recommendations.   Return in about 2 weeks (around 12/13/2020).  Future Appointments  Date Time Provider [place]5/20/2022  2:30 PM [facility]  2:45 PM [facility]  JaEmeterio ReeveMD

## 2020-11-30 LAB — HEPATITIS B SURFACE ANTIGEN: Hepatitis B Surface Ag: NEGATIVE

## 2020-11-30 LAB — HEPATITIS C ANTIBODY: Hep C Virus Ab: <0.1 s/co ratio (ref 0.0–0.9)

## 2020-12-08 ENCOUNTER — Ambulatory Visit: Payer: Medicaid Other | Attending: Obstetrics

## 2020-12-08 ENCOUNTER — Encounter: Payer: Self-pay | Admitting: *Deleted

## 2020-12-08 ENCOUNTER — Other Ambulatory Visit: Payer: Self-pay

## 2020-12-08 ENCOUNTER — Ambulatory Visit: Payer: Medicaid Other | Admitting: *Deleted

## 2020-12-08 DIAGNOSIS — O34219 Maternal care for unspecified type scar from previous cesarean delivery: Secondary | ICD-10-CM | POA: Diagnosis not present

## 2020-12-08 DIAGNOSIS — O24415 Gestational diabetes mellitus in pregnancy, controlled by oral hypoglycemic drugs: Secondary | ICD-10-CM

## 2020-12-08 DIAGNOSIS — O0990 Supervision of high risk pregnancy, unspecified, unspecified trimester: Secondary | ICD-10-CM | POA: Insufficient documentation

## 2020-12-08 DIAGNOSIS — O24419 Gestational diabetes mellitus in pregnancy, unspecified control: Secondary | ICD-10-CM | POA: Diagnosis not present

## 2020-12-08 DIAGNOSIS — O0933 Supervision of pregnancy with insufficient antenatal care, third trimester: Secondary | ICD-10-CM | POA: Diagnosis not present

## 2020-12-08 DIAGNOSIS — Z3A30 30 weeks gestation of pregnancy: Secondary | ICD-10-CM | POA: Diagnosis not present

## 2020-12-08 DIAGNOSIS — O09213 Supervision of pregnancy with history of pre-term labor, third trimester: Secondary | ICD-10-CM

## 2020-12-08 DIAGNOSIS — O352XX0 Maternal care for (suspected) hereditary disease in fetus, not applicable or unspecified: Secondary | ICD-10-CM

## 2020-12-08 DIAGNOSIS — O99213 Obesity complicating pregnancy, third trimester: Secondary | ICD-10-CM | POA: Diagnosis not present

## 2020-12-12 ENCOUNTER — Ambulatory Visit: Payer: Medicaid Other

## 2020-12-12 ENCOUNTER — Other Ambulatory Visit: Payer: Medicaid Other

## 2020-12-13 ENCOUNTER — Other Ambulatory Visit: Payer: Medicaid Other

## 2020-12-13 ENCOUNTER — Ambulatory Visit: Payer: Medicaid Other | Attending: Obstetrics

## 2020-12-14 ENCOUNTER — Other Ambulatory Visit: Payer: Self-pay

## 2020-12-14 ENCOUNTER — Ambulatory Visit: Payer: Medicaid Other | Admitting: *Deleted

## 2020-12-14 ENCOUNTER — Ambulatory Visit (INDEPENDENT_AMBULATORY_CARE_PROVIDER_SITE_OTHER): Payer: Medicaid Other

## 2020-12-14 VITALS — BP 112/72 | HR 100 | Wt 242.7 lb

## 2020-12-14 DIAGNOSIS — O24415 Gestational diabetes mellitus in pregnancy, controlled by oral hypoglycemic drugs: Secondary | ICD-10-CM

## 2020-12-14 DIAGNOSIS — O99210 Obesity complicating pregnancy, unspecified trimester: Secondary | ICD-10-CM

## 2020-12-19 ENCOUNTER — Encounter: Payer: Medicaid Other | Admitting: Family Medicine

## 2020-12-19 ENCOUNTER — Other Ambulatory Visit: Payer: Medicaid Other

## 2020-12-22 ENCOUNTER — Ambulatory Visit: Payer: Medicaid Other | Admitting: *Deleted

## 2020-12-22 ENCOUNTER — Ambulatory Visit (INDEPENDENT_AMBULATORY_CARE_PROVIDER_SITE_OTHER): Payer: Medicaid Other

## 2020-12-22 ENCOUNTER — Other Ambulatory Visit: Payer: Self-pay

## 2020-12-22 VITALS — BP 118/76 | HR 99 | Wt 242.0 lb

## 2020-12-22 DIAGNOSIS — O24415 Gestational diabetes mellitus in pregnancy, controlled by oral hypoglycemic drugs: Secondary | ICD-10-CM

## 2020-12-22 DIAGNOSIS — F32A Depression, unspecified: Secondary | ICD-10-CM

## 2020-12-22 DIAGNOSIS — O99340 Other mental disorders complicating pregnancy, unspecified trimester: Secondary | ICD-10-CM

## 2020-12-22 NOTE — Progress Notes (Signed)
Pt informed that the ultrasound is considered a limited OB ultrasound and is not intended to be a complete ultrasound exam.  Patient also informed that the ultrasound is not being completed with the intent of assessing for fetal or placental anomalies or any pelvic abnormalities.  Explained that the purpose of today's ultrasound is to assess for presentation, BPP and amniotic fluid volume  Patient acknowledges the purpose of the exam and the limitations of the study.    Pt stated she has Hx of depression and feels it is returning. PHQ9 score = 18, GAD7 = 20.  She requested appt w/[facility]. Referral order placed and pt will schedule @ check out today. Pt was also advised of 24/7 mental health assistance available @ [facility] across the street. She voiced understanding.

## 2020-12-25 NOTE — BH Specialist Note (Signed)
Integrated Behavioral Health via Telemedicine Visit  12/25/2020 Beverly Roy [id number]  Number of Integrated Behavioral Health visits: 1 Session Start time: 9:15  Session End time: 10:05 Total time: 50   Referring Provider: [place] Bing, MD Patient/Family location: [facility] Provider location: [facility] for [facility]  All persons participating in visit: [name] and [facility] Beverly Roy   Types of Service: Individual psychotherapy and Video visit  I connected with Barbee Shropshire and/or Loney Laurence  n/a  via  Telephone or Video Enabled Telemedicine Application  (Video is Caregility application) and verified that I am speaking with the correct person using two identifiers. Discussed confidentiality: Yes   I discussed the limitations of telemedicine and the availability of in person appointments.  Discussed there is a possibility of technology failure and discussed alternative modes of communication if that failure occurs.  I discussed that engaging in this telemedicine visit, they consent to the provision of behavioral healthcare and the services will be billed under their insurance.  Patient and/or legal guardian expressed understanding and consented to Telemedicine visit: Yes   Presenting Concerns: Patient and/or family reports the following symptoms/concerns: Pt states her primary concern today is feeling depressed, isolation, poor appetite, sleep difficulty, fatigue, overheated;pt requests to restart BH medication she took previously to treat depression. Pt also concerned about being able to both breastfeed and use bottles with baby, and mild swelling in feet.  Duration of problem: Current pregnancy; Severity of problem:  moderately severe  Patient and/or Family's Strengths/Protective Factors: Social connections, Concrete supports in place (healthy food, safe environments, etc.), and Sense of purpose  Goals  Addressed: Patient will:  Reduce symptoms of: anxiety and depression   Increase knowledge and/or ability of: healthy habits   Demonstrate ability to: Increase healthy adjustment to current life circumstances, Increase adequate support systems for patient/family, and Increase motivation to adhere to plan of care  Progress towards Goals: Ongoing  Interventions: Interventions utilized:  Mining engineer, Psychoeducation and/or Health Education, and Supportive Reflection Standardized Assessments completed: Not Needed  Patient and/or Family Response: Pt agrees to treatment plan  Assessment: Patient currently experiencing Major depressive disorder, recurrent, moderate  Patient may benefit from psychoeducation and brief therapeutic interventions regarding coping with symptoms of depression, anxiety .  Plan: Follow up with behavioral health clinician on : Two weeks; call Asher Muir as needed prior to scheduled visit at [phone] Behavioral recommendations:  -Set up MyChart today; use MyChart help desk as needed -Begin taking BH medication as prescribed -Set reminder on phone daily to call one person(friend/family) with goal of calling and talking to at least 3 friends this week and 3 friends next week -Continue walking to mailbox daily; taking twins outside regularly on nice days -Consider hand-held fan to use to stay cool outside; continue using indoor fans and soaking in cool water for comfort -Plan to talk to lactation consultant in hospital; lactation consultants available onsite at [facility] as well postpartum Referral(s): Integrated [employer] (In Clinic)  I discussed the assessment and treatment plan with the patient and/or parent/guardian. They were provided an opportunity to ask questions and all were answered. They agreed with the plan and demonstrated an understanding of the instructions.   They were advised to call back or seek an in-person  evaluation if the symptoms worsen or if the condition fails to improve as anticipated.  Rae Lips, LCSW  Depression screen [facility] 2/9 12/29/2020 12/22/2020 12/14/2020 08/08/2017 06/26/2017  Decreased Interest  0 3 0 0 0  Down, Depressed, Hopeless 1 3 0 0 0  PHQ - 2 Score 1 6 0 0 0  Altered sleeping 1 3 2 2 2   Tired, decreased energy 1 3 1 1 1   Change in appetite 2 2 0 0 0  Feeling bad or failure about yourself  0 0 0 0 0  Trouble concentrating 2 1 0 0 2  Moving slowly or fidgety/restless 0 3 0 0 0  Suicidal thoughts 0 0 0 0 0  PHQ-9 Score 7 18 3 3 5    GAD 7 : Generalized Anxiety Score 12/29/2020 12/22/2020 12/14/2020 08/08/2017  Nervous, Anxious, on Edge 1 3 1 1   Control/stop worrying 1 3 1  0  Worry too much - different things 1 3 1 1   Trouble relaxing 1 3 1 1   Restless 1 3 1  0  Easily annoyed or irritable 1 3 1 1   Afraid - awful might happen 0 2 0 0  Total GAD 7 Score 6 20 6  4

## 2020-12-29 ENCOUNTER — Other Ambulatory Visit: Payer: Self-pay

## 2020-12-29 ENCOUNTER — Ambulatory Visit (INDEPENDENT_AMBULATORY_CARE_PROVIDER_SITE_OTHER): Payer: Medicaid Other

## 2020-12-29 ENCOUNTER — Ambulatory Visit: Payer: Medicaid Other | Admitting: *Deleted

## 2020-12-29 VITALS — BP 112/71 | HR 99 | Wt 240.8 lb

## 2020-12-29 DIAGNOSIS — O24415 Gestational diabetes mellitus in pregnancy, controlled by oral hypoglycemic drugs: Secondary | ICD-10-CM | POA: Diagnosis not present

## 2020-12-29 NOTE — Progress Notes (Signed)

## 2021-01-01 ENCOUNTER — Other Ambulatory Visit: Payer: Self-pay | Admitting: Family Medicine

## 2021-01-01 ENCOUNTER — Ambulatory Visit (INDEPENDENT_AMBULATORY_CARE_PROVIDER_SITE_OTHER): Payer: Medicaid Other | Admitting: Clinical

## 2021-01-01 ENCOUNTER — Encounter: Payer: Medicaid Other | Admitting: Family Medicine

## 2021-01-01 ENCOUNTER — Encounter: Payer: Self-pay | Admitting: Family Medicine

## 2021-01-01 DIAGNOSIS — F331 Major depressive disorder, recurrent, moderate: Secondary | ICD-10-CM | POA: Diagnosis not present

## 2021-01-01 DIAGNOSIS — O99340 Other mental disorders complicating pregnancy, unspecified trimester: Secondary | ICD-10-CM | POA: Diagnosis not present

## 2021-01-01 DIAGNOSIS — Z3A00 Weeks of gestation of pregnancy not specified: Secondary | ICD-10-CM | POA: Diagnosis not present

## 2021-01-01 DIAGNOSIS — F32A Depression, unspecified: Secondary | ICD-10-CM

## 2021-01-01 MED ORDER — HYDROXYZINE HCL 25 MG PO TABS: 25.0000 mg | ORAL_TABLET | Freq: Four times a day (QID) | ORAL | 2 refills | Status: DC | PRN

## 2021-01-01 MED ORDER — SERTRALINE HCL 25 MG PO TABS
25.0000 mg | ORAL_TABLET | Freq: Every day | ORAL | 3 refills | Status: DC
Start: 2021-01-01 — End: 2021-01-12

## 2021-01-01 NOTE — Progress Notes (Signed)
Epic consult from [facility]. I was scheduled to see patient this afternoon but she will not be able to come due to child's appointment.   She is reporting depressive symptoms and was treated for depression in the past after last pregnancy.   Per reports "she is noticing that depressive feelings are returning, poor appetite, used to call friends every other day, is isolating herself (family has brought it toher attention as they've noticed"  [facility] LCSW Hulda Marin will call patient and counsel about medications.   Federico Flake, MD, MPH, ABFM, [facility] Attending Physician Center for [facility]

## 2021-01-01 NOTE — Patient Instructions (Addendum)
Center for [facility] at [facility] [place], [place] [id number] [phone] (main office) [phone] [facility] office)  [email or web address] (virtual tour of [facility], register for childbirth class, etc) Www.postpartum.net (new mom support groups online)      BRAINSTORMING  Develop a Plan Goals: Provide a way to start conversation about your new life with a baby Assist parents in recognizing and using resources within their reach Help pave the way before birth for an easier period of transition afterwards.  Make a list of the following information to keep in a central location: Full name of Mom and Partner: _____________________________________________ 69 full name and Date of Birth: ___________________________________________ Home Address: ___________________________________________________________ ________________________________________________________________________ Home Phone: ____________________________________________________________ Parents' cell numbers: _____________________________________________________ ________________________________________________________________________ Name and contact info for OB: ______________________________________________ Name and contact info for Pediatrician:________________________________________ Contact info for Lactation Consultants: ________________________________________  REST and SLEEP *You each need at least 4-5 hours of uninterrupted sleep every day. Write specific names and contact information.* How are you going to rest in the postpartum period? While partner's home? When partner returns to work? When you both return to work? Where will your baby sleep? Who is available to help during the day? Evening? Night? Who could move in for a period to help support you? What are some ideas to help you get enough  sleep? __________________________________________________________________________________________________________________________________________________________________________________________________________________________________________ NUTRITIOUS FOOD AND DRINK *Plan for meals before your baby is born so you can have healthy food to eat during the immediate postpartum period.* Who will look after breakfast? Lunch? Dinner? List names and contact information. Brainstorm quick, healthy ideas for each meal. What can you do before baby is born to prepare meals for the postpartum period? How can others help you with meals? Which grocery stores provide online shopping and delivery? Which restaurants offer take-out or delivery options? ______________________________________________________________________________________________________________________________________________________________________________________________________________________________________________________________________________________________________________________________________________________________________________________________________  CARE FOR MOM *It's important that mom is cared for and pampered in the postpartum period. Remember, the most important ways new mothers need care are: sleep, nutrition, gentle exercise, and time off.* Who can come take care of mom during this period? Make a list of people with their contact information. List some activities that make you feel cared for, rested, and energized? Who can make sure you have opportunities to do these things? Does mom have a space of her very own within your home that's just for her? Make a "[facility]" where she can be comfortable, rest, and renew herself  daily. ______________________________________________________________________________________________________________________________________________________________________________________________________________________________________________________________________________________________________________________________________________________________________________________________________    CARE FOR AND FEEDING BABY *Knowledgeable and encouraging people will offer the best support with regard to feeding your baby.* Educate yourself and choose the best feeding option for your baby. Make a list of people who will guide, support, and be a resource for you as your care for and feed your baby. (Friends that have breastfed or are currently breastfeeding, lactation consultants, breastfeeding support groups, etc.) Consider a postpartum doula. (These websites can give you information: dona.org & BuyingShow.es) Seek out local breastfeeding resources like the breastfeeding support group at [employer] or [employer]. ______________________________________________________________________________________________________________________________________________________________________________________________________________________________________________________________________________________________________________________________________________________________________________________________________  Verner Chol AND ERRANDS Who can help with a thorough cleaning before baby is born? Make a list of people who will help with housekeeping and chores, like laundry, light cleaning, dishes, bathrooms, etc. Who can run some errands for you? What can you do to make sure you are stocked with basic supplies before baby is born? Who is going to do the  shopping? ______________________________________________________________________________________________________________________________________________________________________________________________________________________________________________________________________________________________________________________________________________________________________________________________________     Family Adjustment *Nurture yourselves.it helps parents be  more loving and allows for better bonding with their child.* What sorts of things do you and partner enjoy doing together? Which activities help you to connect and strengthen your relationship? Make a list of those things. Make a list of people whom you trust to care for your baby so you can have some time together as a couple. What types of things help partner feel connected to Mom? Make a list. What needs will partner have in order to bond with baby? Other children? Who will care for them when you go into labor and while you are in the hospital? Think about what the needs of your older children might be. Who can help you meet those needs? In what ways are you helping them prepare for bringing baby home? List some specific strategies you have for family adjustment. _______________________________________________________________________________________________________________________________________________________________________________________________________________________________________________________________________________________________________________________________________________  SUPPORT *Someone who can empathize with experiences normalizes your problems and makes them more bearable.* Make a list of other friends, neighbors, and/or co-workers you know with infants (and small children, if applicable) with whom you can connect. Make a list of local or online support groups, mom groups, etc. in which you can be  involved. ______________________________________________________________________________________________________________________________________________________________________________________________________________________________________________________________________________________________________________________________________________________________________________________________________  Childcare Plans Investigate and plan for childcare if mom is returning to work. Talk about mom's concerns about her transition back to work. Talk about partner's concerns regarding this transition.  Mental Health *Your mental health is one of the highest priorities for a pregnant or postpartum mom.* 1 in 5 women experience anxiety and/or depression from the time of conception through the first year after birth. Postpartum Mood Disorders are the #1 complication of pregnancy and childbirth and the suffering experienced by these mothers is not necessary! These illnesses are temporary and respond well to treatment, which often includes self-care, social support, talk therapy, and medication when needed. Women experiencing anxiety and depression often say things like: "I'm supposed to be happy.why do I feel so sad?", "Why can't I snap out of it?", "I'm having thoughts that scare me." There is no need to be embarrassed if you are feeling these symptoms: Overwhelmed, anxious, angry, sad, guilty, irritable, hopeless, exhausted but can't sleep You are NOT alone. You are NOT to blame. With help, you WILL be well. Where can I find help? Medical professionals such as your OB, midwife, gynecologist, family practitioner, primary care provider, pediatrician, or mental health providers; [facility] support groups: Feelings After Birth, Breastfeeding Support Group, Baby and Me Group, and Fit 4 Two exercise classes. You have permission to ask for help. It will confirm your feelings, validate your experiences,  share/learn coping strategies, and gain support and encouragement as you heal. You are important! BRAINSTORM Make a list of local resources, including resources for mom and for partner. Identify support groups. Identify people to call late at night - include names and contact info. Talk with partner about perinatal mood and anxiety disorders. Talk with your OB, midwife, and doula about baby blues and about perinatal mood and anxiety disorders. Talk with your pediatrician about perinatal mood and anxiety disorders.   Support & Sanity Savers   What do you really need?  Basics In preparing for a new baby, many expectant parents spend hours shopping for baby clothes, decorating the nursery, and deciding which car seat to buy. Yet most don't think much about what the reality of parenting a newborn will be like, and what they need to make it through that. So, here is the advice of experienced parents. We know you'll read this,  and think "they're exaggerating, I don't really need that." Just trust [place] on these, OK? Plan for all of this, and if it turns out you don't need it, come back and teach [place] how you did it!  Must-Haves (Once baby's survival needs are met, make sure you attend to your own survival needs!) Sleep An average newborn sleeps 16-18 hours per day, over 6-7 sleep periods, rarely more than three hours at a time. It is normal and healthy for a newborn to wake throughout the night... but really hard on parents!! Naps. Prioritize sleep above any responsibilities like: cleaning house, visiting friends, running errands, etc.  Sleep whenever baby sleeps. If you can't nap, at least have restful times when baby eats. The more rest you get, the more patient you will be, the more emotionally stable, and better at solving problems.  Food You may not have realized it would be difficult to eat when you have a newborn. Yet, when we talk to countless new parents, they say things like "it may be 2:00 pm  when I realize I haven't had breakfast yet." Or "every time we sit down to dinner, baby needs to eat, and my food gets cold, so I don't bother to eat it." Finger food. Before your baby is born, stock up with one months' worth of food that: 1) you can eat with one hand while holding a baby, 2) doesn't need to be prepped, 3) is good hot or cold, 4) doesn't spoil when left out for a few hours, and 5) you like to eat. Think about: nuts, dried fruit, Clif bars, pretzels, jerky, gogurt, baby carrots, apples, bananas, crackers, cheez-n-crackers, string cheese, hot pockets or frozen burritos to microwave, garden burgers and breakfast pastries to put in the toaster, yogurt drinks, etc. Restaurant Menus. Make lists of your favorite restaurants & menu items. When family/friends want to help, you can give specific information without much thought. They can either bring you the food or send gift cards for just the right meals. Freezer Meals.  Take some time to make a few meals to put in the freezer ahead of time.  Easy to freeze meals can be anything such as soup, lasagna, chicken pie, or spaghetti sauce. Set up a Meal Schedule.  Ask friends and family to sign up to bring you meals during the first few weeks of being home. (It can be passed around at baby showers!) You have no idea how helpful this will be until you are in the throes of parenting.  https://hamilton-woodard.com/ is a great website to check out. Emotional Support Know who to call when you're stressed out. Parenting a newborn is very challenging work. There are times when it totally overwhelms your normal coping abilities. EVERY NEW PARENT NEEDS TO HAVE A PLAN FOR WHO TO CALL WHEN THEY JUST CAN'T COPE ANY MORE. (And it has to be someone other than the baby's other parent!) Before your baby is born, come up with at least one person you can call for support - write their phone number down and post it on the refrigerator. Anxiety & Sadness. Baby blues are normal after  pregnancy; however, there are more severe types of anxiety & sadness which can occur and should not be ignored.  They are always treatable, but you have to take the first step by reaching out for help. [facility] offers a "Mom Talk" group which meets every Tuesday from 10 am - 11 am.  This group is for new moms who  need support and connection after their babies are born.  Call [phone].  Really, Really Helpful (Plan for them! Make sure these happen often!!) Physical Support with Taking Care of Yourselves Asking friends and family. Before your baby is born, set up a schedule of people who can come and visit and help out (or ask a friend to schedule for you). Any time someone says "let me know what I can do to help," sign them up for a day. When they get there, their job is not to take care of the baby (that's your job and your joy). Their job is to take care of you!  Postpartum doulas. If you don't have anyone you can call on for support, look into postpartum doulas:  professionals at helping parents with caring for baby, caring for themselves, getting breastfeeding started, and helping with household tasks. www.padanc.org is a helpful website for learning about doulas in our area. Peer Support / Parent Groups Why: One of the greatest ideas for new parents is to be around other new parents. Parent groups give you a chance to share and listen to others who are going through the same season of life, get a sense of what is normal infant development by watching several babies learn and grow, share your stories of triumph and struggles with empathetic ears, and forgive your own mistakes when you realize all parents are learning by trial and error. Where to find: There are many places you can meet other new parents throughout our community.  [facility] offers the following classes for new moms and their little ones:  Baby and Me (Birth to [place]) and Breastfeeding Support Group. Go to  www.conehealthybaby.com or call [phone] for more information. Time for your Relationship It's easy to get so caught up in meeting baby's immediate needs that it's hard to find time to connect with your partner, and meet the needs of your relationship. It's also easy to forget what "quality time with your partner" actually looks like. If you take your baby on a date, you'd be amazed how much of your couple time is spent feeding the baby, diapering the baby, admiring the baby, and talking about the baby. Dating: Try to take time for just the two of you. Babysitter tip: Sometimes when moms are breastfeeding a newborn, they find it hard to figure out how to schedule outings around baby's unpredictable feeding schedules. Have the babysitter come for a three hour period. When she comes over, if baby has just eaten, you can leave right away, and come back in two hours. If baby hasn't fed recently, you start the date at home. Once baby gets hungry and gets a good feeding in, you can head out for the rest of your date time. Date Nights at Home: If you can't get out, at least set aside one evening a week to prioritize your relationship: whenever baby dozes off or doesn't have any immediate needs, spend a little time focusing on each other. Potential conflicts: The main relationship conflicts that come up for new parents are: issues related to sexuality, financial stresses, a feeling of an unfair division of household tasks, and conflicts in parenting styles. The more you can work on these issues before baby arrives, the better!  Fun and Frills (Don't forget these. and don't feel guilty for indulging in them!) Everyone has something in life that is a fun little treat that they do just for themselves. It may be: reading the morning paper, or going for a  daily jog, or having coffee with a friend once a week, or going to a movie on Friday nights, or fine chocolates, or bubble baths, or curling up with a good  book. Unless you do fun things for yourself every now and then, it's hard to have the energy for fun with your baby. Whatever your "special" treats are, make sure you find a way to continue to indulge in them after your baby is born. These special moments can recharge you, and allow you to return to baby with a new joy   PERINATAL MOOD DISORDERS: [place]   Emergency and Crisis Resources:  If you are an imminent risk to self or others, are experiencing intense personal distress, and/or have noticed significant changes in activities of daily living, call:  [facility]: [place]: [place]: [phone] Or visit the following crisis centers: Local Emergency Dep[facility]: [address], [place]. Hours: 8:30AM-5PM. Insurance Accepted: Medicaid, Medicare, and Uninsured.  [facility]  [address], [place] Mon-Friday 8am-3pm  [phone]                                                                                    Non-Crisis Resources: To identify specific providers that are covered by your insurance, contact your insurance company or local agencies: [place]: [phone] [facility] and [place]: [phone] [place]: [phone] Postpartum Support International- [place] [phone]                                                      Outpatient therapy and medication management providers:  [facility] [phone] Hours: 9AM-5PM  Insurance Accepted: Alben Spittle, Lorella Nimrod, Freddrick March, [place], Medicare  [facility] ([place]) [phone] Hours: 8AM-5PM  nsurance Accepted: All insurances EXCEPT AARP, [place], and [place]: [phone]             Hours: 8AM-8PM Insurance  Accepted: Cristal Ford, Freddrick March, [place], Medicare, [phone] [facility]: [phone] Hours: 8:30AM-7PM Insurance Accepted: Cristal Ford, Medicaid, Medicare, Tricare, [employer] Counseling:  [place] Accepted:  Holland Falling, Lorella Nimrod, [employer], [place], [facility] [phone] Hours: 9AM-5:30PM Insurance Accepted: Alben Spittle, Charlotte Crumb, and Medicaid, Medicare, [employer] [facility] Counsel[facility]:  [phone] Hours: 9am-5pm Insurance Accepted: BCBS; they do not accept Medicaid/Medicare [place]: [phone] Hours: 9am-9pm Insurance Accepted: All major insurance including Medicaid and Medicare [facility]: [phone] Hours: 9AM-5:30PM Insurance Accepted: All insurances EXCEPT Medicaid and Medicare. [facility]: [phone]  [place] [place]: [phone] [facility]:  [place] (support for children in the NICU and/or with special needs), [place]: [phone]                                                                                     Online Resources: Postpartum Support International: http://jones-berg.com/  800-944-4PPD 2Moms Supporting Moms:  www.momssupportingmoms.net     /Emotional [employer] and Websites Here are a few free apps meant to help you to help yourself.  To find, try searching on the internet to see if the app is offered on Apple/Android devices. If your first choice doesn't come up on your device, the good news is that there are many choices! Play around with  different apps to see which ones are helpful to you.    Calm This is an app meant to help increase calm feelings. Includes info, strategies, and tools for tracking your feelings.      Calm Harm  This app is meant to help with self-harm. Provides many 5-minute or 15-min coping strategies for doing instead of hurting yourself.       Chrisney is a problem-solving tool to help deal with emotions and cope with stress you encounter wherever you are.      MindShift This app can help people cope with anxiety. Rather than trying to avoid anxiety, you can make an important shift and face it.      MY3  MY3 features a support system, safety plan and resources with the goal of offering a tool to use in a time of need.       My Life My Voice  This mood journal offers a simple solution for tracking your thoughts, feelings and moods. Animated emoticons can help identify your mood.       Relax Melodies Designed to help with sleep, on this app you can mix sounds and meditations for relaxation.      Smiling Mind Smiling Mind is meditation made easy: it's a simple tool that helps put a smile on your mind.        Stop, Breathe & Think  A friendly, simple guide for people through meditations for mindfulness and compassion.  Stop, Breathe and Think Kids Enter your current feelings and choose a "mission" to help you cope. Offers videos for certain moods instead of just sound recordings.       Team Orange The goal of this tool is to help teens change how they think, act, and react. This app helps you focus on your own good feelings and experiences.      The [place] Box The [place] Box (VHB) contains simple tools to help patients  with coping, relaxation, distraction, and positive thinking.

## 2021-01-02 NOTE — BH Specialist Note (Signed)
Pt did not arrive to video visit and did not answer the phone ; Left HIPPA-compliant message to call back Halden Phegley from [facility] at  [phone] (Samauri Kellenberger's office).    

## 2021-01-05 ENCOUNTER — Ambulatory Visit: Payer: Medicaid Other | Attending: Obstetrics

## 2021-01-05 ENCOUNTER — Ambulatory Visit: Payer: Medicaid Other | Admitting: *Deleted

## 2021-01-05 ENCOUNTER — Other Ambulatory Visit: Payer: Self-pay

## 2021-01-05 ENCOUNTER — Other Ambulatory Visit: Payer: Self-pay | Admitting: Obstetrics and Gynecology

## 2021-01-05 ENCOUNTER — Encounter: Payer: Self-pay | Admitting: *Deleted

## 2021-01-05 VITALS — BP 109/69 | HR 109

## 2021-01-05 DIAGNOSIS — Z3A34 34 weeks gestation of pregnancy: Secondary | ICD-10-CM

## 2021-01-05 DIAGNOSIS — O352XX0 Maternal care for (suspected) hereditary disease in fetus, not applicable or unspecified: Secondary | ICD-10-CM

## 2021-01-05 DIAGNOSIS — E669 Obesity, unspecified: Secondary | ICD-10-CM

## 2021-01-05 DIAGNOSIS — O09293 Supervision of pregnancy with other poor reproductive or obstetric history, third trimester: Secondary | ICD-10-CM | POA: Diagnosis not present

## 2021-01-05 DIAGNOSIS — O24415 Gestational diabetes mellitus in pregnancy, controlled by oral hypoglycemic drugs: Secondary | ICD-10-CM

## 2021-01-05 DIAGNOSIS — O99213 Obesity complicating pregnancy, third trimester: Secondary | ICD-10-CM

## 2021-01-05 DIAGNOSIS — F32A Depression, unspecified: Secondary | ICD-10-CM | POA: Diagnosis present

## 2021-01-05 DIAGNOSIS — Z8759 Personal history of other complications of pregnancy, childbirth and the puerperium: Secondary | ICD-10-CM | POA: Diagnosis not present

## 2021-01-05 DIAGNOSIS — O34219 Maternal care for unspecified type scar from previous cesarean delivery: Secondary | ICD-10-CM

## 2021-01-05 DIAGNOSIS — O99210 Obesity complicating pregnancy, unspecified trimester: Secondary | ICD-10-CM

## 2021-01-05 DIAGNOSIS — O99340 Other mental disorders complicating pregnancy, unspecified trimester: Secondary | ICD-10-CM | POA: Insufficient documentation

## 2021-01-05 DIAGNOSIS — O09213 Supervision of pregnancy with history of pre-term labor, third trimester: Secondary | ICD-10-CM | POA: Diagnosis not present

## 2021-01-05 DIAGNOSIS — Z362 Encounter for other antenatal screening follow-up: Secondary | ICD-10-CM | POA: Diagnosis not present

## 2021-01-05 DIAGNOSIS — O0990 Supervision of high risk pregnancy, unspecified, unspecified trimester: Secondary | ICD-10-CM

## 2021-01-05 DIAGNOSIS — O0933 Supervision of pregnancy with insufficient antenatal care, third trimester: Secondary | ICD-10-CM | POA: Diagnosis not present

## 2021-01-12 ENCOUNTER — Ambulatory Visit (INDEPENDENT_AMBULATORY_CARE_PROVIDER_SITE_OTHER): Payer: Medicaid Other

## 2021-01-12 ENCOUNTER — Other Ambulatory Visit: Payer: Self-pay

## 2021-01-12 ENCOUNTER — Encounter: Payer: Self-pay | Admitting: Obstetrics and Gynecology

## 2021-01-12 ENCOUNTER — Ambulatory Visit (INDEPENDENT_AMBULATORY_CARE_PROVIDER_SITE_OTHER): Payer: Medicaid Other | Admitting: General Practice

## 2021-01-12 ENCOUNTER — Ambulatory Visit (INDEPENDENT_AMBULATORY_CARE_PROVIDER_SITE_OTHER): Payer: Medicaid Other | Admitting: Obstetrics and Gynecology

## 2021-01-12 VITALS — BP 119/78 | HR 97 | Wt 245.0 lb

## 2021-01-12 DIAGNOSIS — Z3A35 35 weeks gestation of pregnancy: Secondary | ICD-10-CM | POA: Diagnosis not present

## 2021-01-12 DIAGNOSIS — O24415 Gestational diabetes mellitus in pregnancy, controlled by oral hypoglycemic drugs: Secondary | ICD-10-CM

## 2021-01-12 DIAGNOSIS — O99340 Other mental disorders complicating pregnancy, unspecified trimester: Secondary | ICD-10-CM

## 2021-01-12 DIAGNOSIS — O0990 Supervision of high risk pregnancy, unspecified, unspecified trimester: Secondary | ICD-10-CM

## 2021-01-12 DIAGNOSIS — F32A Depression, unspecified: Secondary | ICD-10-CM

## 2021-01-12 DIAGNOSIS — Z98891 History of uterine scar from previous surgery: Secondary | ICD-10-CM

## 2021-01-12 MED ORDER — SERTRALINE HCL 25 MG PO TABS: 25.0000 mg | ORAL_TABLET | Freq: Every day | ORAL | 3 refills | Status: DC

## 2021-01-12 NOTE — Progress Notes (Signed)
Subjective:  Beverly Roy is a 25 y.o. G2P0102 at [redacted]w[redacted]d being seen today for ongoing prenatal care.  She is currently monitored for the following issues for this high-risk pregnancy and has Asthma; ADHD; Gestational diabetes mellitus (GDM), antepartum; Supervision of high risk pregnancy, antepartum; History of cesarean section; History of fetal anomaly in prior pregnancy, currently pregnant; and Depression affecting pregnancy on their problem list.  Patient reports general discomforts of pregnancy.  Contractions: Irritability. Vag. Bleeding: None.  Movement: Present. Denies leaking of fluid.   The following portions of the patient's history were reviewed and updated as appropriate: allergies, current medications, past family history, past medical history, past social history, past surgical history and problem list. Problem list updated.  Objective:   Vitals:   01/12/21 0843  BP: 119/78  Pulse: 97  Weight: 245 lb (111.1 kg)    Fetal Status: Fetal Heart Rate (bpm): NST   Movement: Present     General:  Alert, oriented and cooperative. Patient is in no acute distress.  Skin: Skin is warm and dry. No rash noted.   Cardiovascular: Normal heart rate noted  Respiratory: Normal respiratory effort, no problems with respiration noted  Abdomen: Soft, gravid, appropriate for gestational age. Pain/Pressure: Present     Pelvic:  Cervical exam deferred        Extremities: Normal range of motion.  Edema: Mild pitting, slight indentation  Mental Status: Normal mood and affect. Normal behavior. Normal judgment and thought content.   Urinalysis:      Assessment and Plan:  Pregnancy: G2P0102 at [redacted]w[redacted]d  1. Supervision of high risk pregnancy, antepartum Stable GBS next visit  2. Gestational diabetes mellitus (GDM) controlled on oral hypoglycemic drug, antepartum Did not bring CBG readings BPP 10/10 Growth 67 % on 01/05/2021 Continue with Metformin and weekly testing  3. History of cesarean  section For repeat at 39 weeks  4. Depression affecting pregnancy Stable - sertraline (ZOLOFT) 25 MG tablet; Take 1 tablet (25 mg total) by mouth daily. If no adverse sx in 1 week, increase to 50mg  (2 pills) daily.  Dispense: 45 tablet; Refill: 3  Preterm labor symptoms and general obstetric precautions including but not limited to vaginal bleeding, contractions, leaking of fluid and fetal movement were reviewed in detail with the patient. Please refer to After Visit Summary for other counseling recommendations.  Return in about 1 week (around 01/19/2021) for OB visit, face to face, MD only.   03/22/2021, MD

## 2021-01-12 NOTE — Progress Notes (Signed)
Pt informed that the ultrasound is considered a limited OB ultrasound and is not intended to be a complete ultrasound exam.  Patient also informed that the ultrasound is not being completed with the intent of assessing for fetal or placental anomalies or any pelvic abnormalities.  Explained that the purpose of today's ultrasound is to assess for  BPP, presentation, and AFI.  Patient acknowledges the purpose of the exam and the limitations of the study.     Chase Caller RN BSN 01/12/21

## 2021-01-12 NOTE — Patient Instructions (Signed)

## 2021-01-15 ENCOUNTER — Encounter: Payer: Medicaid Other | Admitting: Student

## 2021-01-15 ENCOUNTER — Other Ambulatory Visit: Payer: Medicaid Other

## 2021-01-15 ENCOUNTER — Ambulatory Visit: Payer: Medicaid Other | Admitting: Clinical

## 2021-01-15 DIAGNOSIS — Z91199 Patient's noncompliance with other medical treatment and regimen due to unspecified reason: Secondary | ICD-10-CM

## 2021-01-16 ENCOUNTER — Telehealth: Payer: Self-pay | Admitting: *Deleted

## 2021-01-16 ENCOUNTER — Encounter: Payer: Self-pay | Admitting: *Deleted

## 2021-01-16 NOTE — Telephone Encounter (Signed)
Call to patient. Left message "procedure scheduled for 7-25 at 0930 and arrive at 0730" Left message will receive letter and phone call with additional instructions. Call back number [phone] if questions.

## 2021-01-18 ENCOUNTER — Other Ambulatory Visit: Payer: Self-pay

## 2021-01-18 ENCOUNTER — Ambulatory Visit (INDEPENDENT_AMBULATORY_CARE_PROVIDER_SITE_OTHER): Payer: Medicaid Other | Admitting: Obstetrics and Gynecology

## 2021-01-18 ENCOUNTER — Encounter: Payer: Self-pay | Admitting: Obstetrics and Gynecology

## 2021-01-18 VITALS — BP 114/77 | HR 97 | Wt 244.0 lb

## 2021-01-18 DIAGNOSIS — O99340 Other mental disorders complicating pregnancy, unspecified trimester: Secondary | ICD-10-CM

## 2021-01-18 DIAGNOSIS — Z98891 History of uterine scar from previous surgery: Secondary | ICD-10-CM

## 2021-01-18 DIAGNOSIS — F32A Depression, unspecified: Secondary | ICD-10-CM

## 2021-01-18 DIAGNOSIS — Z3A35 35 weeks gestation of pregnancy: Secondary | ICD-10-CM

## 2021-01-18 DIAGNOSIS — O09299 Supervision of pregnancy with other poor reproductive or obstetric history, unspecified trimester: Secondary | ICD-10-CM

## 2021-01-18 DIAGNOSIS — O24415 Gestational diabetes mellitus in pregnancy, controlled by oral hypoglycemic drugs: Secondary | ICD-10-CM

## 2021-01-18 DIAGNOSIS — O0990 Supervision of high risk pregnancy, unspecified, unspecified trimester: Secondary | ICD-10-CM

## 2021-01-18 MED ORDER — METFORMIN HCL 500 MG PO TABS: 1000.0000 mg | ORAL_TABLET | Freq: Two times a day (BID) | ORAL | 5 refills | Status: DC

## 2021-01-18 NOTE — Progress Notes (Signed)
PRENATAL VISIT NOTE  Subjective:  Beverly Roy is a 25 y.o. G2P0102 at [redacted]w[redacted]d being seen today for ongoing prenatal care.  She is currently monitored for the following issues for this high-risk pregnancy and has Asthma; ADHD; Gestational diabetes mellitus (GDM), antepartum; Supervision of high risk pregnancy, antepartum; History of cesarean section; History of fetal anomaly in prior pregnancy, currently pregnant; Depression affecting pregnancy; and [redacted] weeks gestation of pregnancy on their problem list.  Patient reports no complaints. Pt reports good adherence to metformin but difficulty with portion control. Pt did not bring her Dexcom glucometer today but states that her sugar levels have been consistently elevated. Contractions: Irritability. Vag. Bleeding: None.  Movement: Present. Denies leaking of fluid.   The following portions of the patient's history were reviewed and updated as appropriate: allergies, current medications, past family history, past medical history, past social history, past surgical history and problem list.   Objective:   Vitals:   01/18/21 1535  BP: 114/77  Pulse: 97  Weight: 244 lb (110.7 kg)    Fetal Status: Fetal Heart Rate (bpm): 157   Movement: Present     General:  Alert, oriented and cooperative. Patient is in no acute distress.  Skin: Skin is warm and dry. No rash noted.   Cardiovascular: Normal heart rate noted  Respiratory: Normal respiratory effort, no problems with respiration noted  Abdomen: Soft, gravid, appropriate for gestational age.  Pain/Pressure: Present     Pelvic: Cervical exam deferred        Extremities: Normal range of motion.  Edema: Mild pitting, slight indentation  Mental Status: Normal mood and affect. Normal behavior. Normal judgment and thought content.   Assessment and Plan:  Pregnancy: G2P0102 at [redacted]w[redacted]d 1. Supervision of high risk pregnancy, [redacted] weeks gestation of pregnancy: No reported concerns today. No red flag symptoms.  Normal blood pressure and FHTs today. Fundal height S > D but reassuringly, growth [place] on 6/17 showed EFW 67%ile. Plans for breast & formula feeding. Desires IP Nexplanon s/p conversation today. - continued weekly antenatal testing & growth [place] on 7/15 as noted below - Repeat Cesarean scheduled for 02/12/21 (pt declines TOLAC at this time) - RTC in 1 week for f/u prenatal appt or sooner as needed for strict return precautions  2. Gestational diabetes mellitus (GDM) controlled on oral hypoglycemic drug, antepartum: Pt reports daily difficulty with portion control and healthy eating but reports good adherence to metformin. S/p appt with nutritionist. Checking BG levels daily; checks fasting, 2hr after breakfast, lunch & dinner. Reports fasting BG: 90s; PP levels >120 more than half the time - taking metformin 500mg  BID > increased metformin to 1g BID today - emphasized importance of bringing glucometer to f/u appt to ensure adequate control - continue weekly antenatal testing; last growth scheduled 02/02/21 - repeat Cesarean scheduled @[redacted]w[redacted]d  as noted above  3. 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  -Scheduled repeat elective Cesarean on 02/12/21 per pt preference  4. 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  5. Depression affecting pregnancy: Pt reports ongoing low energy today but reports improvement in mood with good adherence to zoloft 25mg  daily. No reported safety concerns. - recommended increase in zoloft to 50mg  daily given low energy  Preterm labor symptoms and general obstetric precautions including but not limited to vaginal bleeding, contractions, leaking of fluid and fetal movement were reviewed in detail with the patient. Please  refer to After Visit Summary for other counseling recommendations.   Return in about 1 week (around 01/25/2021) for needs f/u  prenatal AND BPP in 1 week.  Future Appointments  Date Time Provider Department Center  01/24/2021  1:15 PM [facility] NST [facility]  01/24/2021  2:15 PM Adam Phenix, MD [facility]  01/29/2021  8:15 AM [facility] NST [facility]  02/02/2021  2:30 PM [facility] NURSE [facility]  02/02/2021  2:45 PM [facility] US4 [facility]  02/07/2021  1:15 PM [facility] NST [facility]  02/14/2021  2:15 PM [facility] NST [facility]  02/21/2021  9:15 AM [facility] NST [facility]    Sheila Oats, MD

## 2021-01-24 ENCOUNTER — Other Ambulatory Visit: Payer: Medicaid Other

## 2021-01-24 ENCOUNTER — Encounter: Payer: Medicaid Other | Admitting: Obstetrics & Gynecology

## 2021-01-29 ENCOUNTER — Ambulatory Visit (INDEPENDENT_AMBULATORY_CARE_PROVIDER_SITE_OTHER): Payer: Medicaid Other

## 2021-01-29 ENCOUNTER — Encounter (HOSPITAL_COMMUNITY): Payer: Self-pay

## 2021-01-29 ENCOUNTER — Other Ambulatory Visit: Payer: Self-pay

## 2021-01-29 ENCOUNTER — Ambulatory Visit: Payer: Medicaid Other | Admitting: *Deleted

## 2021-01-29 VITALS — BP 131/86 | HR 87 | Wt 244.7 lb

## 2021-01-29 DIAGNOSIS — O24415 Gestational diabetes mellitus in pregnancy, controlled by oral hypoglycemic drugs: Secondary | ICD-10-CM | POA: Diagnosis not present

## 2021-01-29 DIAGNOSIS — O99210 Obesity complicating pregnancy, unspecified trimester: Secondary | ICD-10-CM | POA: Diagnosis not present

## 2021-01-29 NOTE — Patient Instructions (Addendum)
Linley Moskal  01/29/2021   Your procedure is scheduled on:  02/12/2021  Arrive at 0730 at Entrance C on [address] at [facility]  and [address]. You are invited to use the FREE valet parking or use the Visitor's parking deck.  Pick up the phone at the desk and dial [phone].  Call this number if you have problems the morning of surgery: [phone]  Remember:   Do not eat food:(After Midnight) Desps de medianoche.  Do not drink clear liquids: (After Midnight) Desps de medianoche.  Take these medicines the morning of surgery with A SIP OF WATER:  Take zoloft as prescribed.  DO NOT take metformin   Do not wear jewelry, make-up or nail polish.  Do not wear lotions, powders, or perfumes. Do not wear deodorant.  Do not shave 48 hours prior to surgery.  Do not bring valuables to the hospital.  [facility] is not   responsible for any belongings or valuables brought to the hospital.  Contacts, dentures or bridgework may not be worn into surgery.  Leave suitcase in the car. After surgery it may be brought to your room.  For patients admitted to the hospital, checkout time is 11:00 AM the day of              discharge.      Please read over the following fact sheets that you were given:     Preparing for Surgery

## 2021-01-29 NOTE — Progress Notes (Signed)

## 2021-02-01 ENCOUNTER — Ambulatory Visit (INDEPENDENT_AMBULATORY_CARE_PROVIDER_SITE_OTHER): Payer: Medicaid Other | Admitting: Obstetrics and Gynecology

## 2021-02-01 ENCOUNTER — Other Ambulatory Visit (HOSPITAL_COMMUNITY)
Admission: RE | Admit: 2021-02-01 | Discharge: 2021-02-01 | Disposition: A | Discharge status: Home / Self Care | Payer: Medicaid Other | Source: Ambulatory Visit | Attending: Obstetrics & Gynecology | Admitting: Obstetrics & Gynecology

## 2021-02-01 ENCOUNTER — Other Ambulatory Visit: Payer: Self-pay

## 2021-02-01 VITALS — BP 118/82 | HR 95 | Wt 251.9 lb

## 2021-02-01 DIAGNOSIS — O0990 Supervision of high risk pregnancy, unspecified, unspecified trimester: Secondary | ICD-10-CM

## 2021-02-01 DIAGNOSIS — O0993 Supervision of high risk pregnancy, unspecified, third trimester: Secondary | ICD-10-CM

## 2021-02-01 DIAGNOSIS — F32A Depression, unspecified: Secondary | ICD-10-CM

## 2021-02-01 DIAGNOSIS — O09299 Supervision of pregnancy with other poor reproductive or obstetric history, unspecified trimester: Secondary | ICD-10-CM

## 2021-02-01 DIAGNOSIS — O99340 Other mental disorders complicating pregnancy, unspecified trimester: Secondary | ICD-10-CM

## 2021-02-01 DIAGNOSIS — Z3A37 37 weeks gestation of pregnancy: Secondary | ICD-10-CM | POA: Insufficient documentation

## 2021-02-01 DIAGNOSIS — Z98891 History of uterine scar from previous surgery: Secondary | ICD-10-CM

## 2021-02-01 DIAGNOSIS — J4520 Mild intermittent asthma, uncomplicated: Secondary | ICD-10-CM

## 2021-02-01 DIAGNOSIS — O24415 Gestational diabetes mellitus in pregnancy, controlled by oral hypoglycemic drugs: Secondary | ICD-10-CM

## 2021-02-01 MED ORDER — PRENATAL VITAMIN 27-0.8 MG PO TABS: 1.0000 | ORAL_TABLET | Freq: Every day | ORAL | 1 refills | Status: DC

## 2021-02-01 MED ORDER — METFORMIN HCL 500 MG PO TABS: 1000.0000 mg | ORAL_TABLET | Freq: Two times a day (BID) | ORAL | 5 refills | Status: DC

## 2021-02-01 NOTE — Progress Notes (Signed)
   PRENATAL VISIT NOTE  Subjective:  Beverly Roy is a 25 y.o. G2P0102 at [redacted]w[redacted]d being seen today for ongoing prenatal care.  She is currently monitored for the following issues for this high-risk pregnancy and has Asthma; ADHD; Gestational diabetes mellitus (GDM), antepartum; Supervision of high risk pregnancy, antepartum; History of cesarean section; History of fetal anomaly in prior pregnancy, currently pregnant; Depression affecting pregnancy; and [redacted] weeks gestation of pregnancy on their problem list.  Patient reports no complaints.  Contractions: Irritability. Vag. Bleeding: None.  Movement: Present. Denies leaking of fluid.   The following portions of the patient's history were reviewed and updated as appropriate: allergies, current medications, past family history, past medical history, past social history, past surgical history and problem list.   Objective:   Vitals:   02/01/21 1537  BP: 118/82  Pulse: 95  Weight: 251 lb 14.4 oz (114.3 kg)    Fetal Status: Fetal Heart Rate (bpm): 175   Movement: Present     General:  Alert, oriented and cooperative. Patient is in no acute distress.  Skin: Skin is warm and dry. No rash noted.   Cardiovascular: Normal heart rate noted  Respiratory: Normal respiratory effort, no problems with respiration noted  Abdomen: Soft, gravid, appropriate for gestational age.  Pain/Pressure: Present     Pelvic: Cervical exam deferred        Extremities: Normal range of motion.     Mental Status: Normal mood and affect. Normal behavior. Normal judgment and thought content.   Assessment and Plan:  Pregnancy: G2P0102 at [redacted]w[redacted]d 1. Supervision of high risk pregnancy in third trimester, [redacted] weeks gestation of pregnancy: No concerns today. Pt re-confirms desire for repeat Cesarean. Normal BP and FHTs today. Appropriate fundal height.  - GC/Chlamydia probe amp ([place])not at [facility] - Culture, beta strep (group b only) - RTC on 7/20 for f/u prenatal appt;  strict return precautions as noted below - Scheduled repeat Cesarean on 02/12/21  2. Gestational diabetes mellitus (GDM) controlled on oral hypoglycemic drug, antepartum: All fasting BG levels at goal 90-95 per pt report, and pt checking BG levels consistently. EFW 67% at [redacted]w[redacted]d. - continue metformin 1g BID (increased dose at last appt on 6/30) - Growth [place] & BPP scheduled for 7/15  3. 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  -Scheduled repeat elective Cesarean on 02/12/21 per pt preference  4. 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  5. Depression affecting pregnancy: No safety concerns today. Recently increased zoloft dose to 50mg  daily. - given concern of daytime fatigue, recommended pt switch zoloft dosing to qHS  6. Mild intermittent asthma without complication  Term labor symptoms and general obstetric precautions including but not limited to vaginal bleeding, contractions, leaking of fluid and fetal movement were reviewed in detail with the patient. Please refer to After Visit Summary for other counseling recommendations.   No follow-ups on file.  Future Appointments  Date Time Provider Department Center  02/02/2021  2:30 PM [facility] NURSE [facility]  02/02/2021  2:45 PM [facility] US4 [facility]  02/07/2021  1:15 PM [facility] NST [facility]  02/07/2021  2:15 PM 02/09/2021, MD [facility]  02/09/2021  9:30 AM [facility]LD PAT 1 [facility]INDC None  02/09/2021 10:30 AM [facility]-SCREENING [facility] None    02/11/2021, MD

## 2021-02-02 ENCOUNTER — Other Ambulatory Visit: Payer: Self-pay | Admitting: Maternal & Fetal Medicine

## 2021-02-02 ENCOUNTER — Telehealth (HOSPITAL_COMMUNITY): Payer: Self-pay | Admitting: *Deleted

## 2021-02-02 ENCOUNTER — Encounter: Payer: Self-pay | Admitting: *Deleted

## 2021-02-02 ENCOUNTER — Ambulatory Visit: Payer: Medicaid Other | Attending: Maternal & Fetal Medicine

## 2021-02-02 ENCOUNTER — Ambulatory Visit: Payer: Medicaid Other | Admitting: *Deleted

## 2021-02-02 VITALS — BP 125/76 | HR 93

## 2021-02-02 DIAGNOSIS — O0930 Supervision of pregnancy with insufficient antenatal care, unspecified trimester: Secondary | ICD-10-CM | POA: Insufficient documentation

## 2021-02-02 DIAGNOSIS — O09293 Supervision of pregnancy with other poor reproductive or obstetric history, third trimester: Secondary | ICD-10-CM | POA: Diagnosis not present

## 2021-02-02 DIAGNOSIS — O24410 Gestational diabetes mellitus in pregnancy, diet controlled: Secondary | ICD-10-CM

## 2021-02-02 DIAGNOSIS — O34219 Maternal care for unspecified type scar from previous cesarean delivery: Secondary | ICD-10-CM

## 2021-02-02 DIAGNOSIS — Z98891 History of uterine scar from previous surgery: Secondary | ICD-10-CM

## 2021-02-02 DIAGNOSIS — O0933 Supervision of pregnancy with insufficient antenatal care, third trimester: Secondary | ICD-10-CM

## 2021-02-02 DIAGNOSIS — E669 Obesity, unspecified: Secondary | ICD-10-CM | POA: Diagnosis not present

## 2021-02-02 DIAGNOSIS — Z8759 Personal history of other complications of pregnancy, childbirth and the puerperium: Secondary | ICD-10-CM

## 2021-02-02 DIAGNOSIS — O352XX0 Maternal care for (suspected) hereditary disease in fetus, not applicable or unspecified: Secondary | ICD-10-CM | POA: Diagnosis not present

## 2021-02-02 DIAGNOSIS — Z8632 Personal history of gestational diabetes: Secondary | ICD-10-CM | POA: Diagnosis not present

## 2021-02-02 DIAGNOSIS — O09213 Supervision of pregnancy with history of pre-term labor, third trimester: Secondary | ICD-10-CM | POA: Diagnosis not present

## 2021-02-02 DIAGNOSIS — Z3A38 38 weeks gestation of pregnancy: Secondary | ICD-10-CM | POA: Diagnosis not present

## 2021-02-02 DIAGNOSIS — O99213 Obesity complicating pregnancy, third trimester: Secondary | ICD-10-CM

## 2021-02-02 DIAGNOSIS — O24415 Gestational diabetes mellitus in pregnancy, controlled by oral hypoglycemic drugs: Secondary | ICD-10-CM | POA: Diagnosis not present

## 2021-02-02 DIAGNOSIS — O0990 Supervision of high risk pregnancy, unspecified, unspecified trimester: Secondary | ICD-10-CM | POA: Diagnosis present

## 2021-02-02 DIAGNOSIS — O99340 Other mental disorders complicating pregnancy, unspecified trimester: Secondary | ICD-10-CM

## 2021-02-02 DIAGNOSIS — F32A Depression, unspecified: Secondary | ICD-10-CM | POA: Diagnosis present

## 2021-02-02 LAB — GC/CHLAMYDIA PROBE AMP (~~LOC~~) NOT AT ARMC
Chlamydia: NEGATIVE
Comment: NEGATIVE
Comment: NORMAL
Neisseria Gonorrhea: NEGATIVE

## 2021-02-02 NOTE — Telephone Encounter (Signed)
Preadmission screen  Message left at pt request to reschedule her PAT appt

## 2021-02-02 NOTE — Telephone Encounter (Signed)
Preadmission screen  

## 2021-02-05 LAB — CULTURE, BETA STREP (GROUP B ONLY): Strep Gp B Culture: NEGATIVE

## 2021-02-05 NOTE — BH Specialist Note (Signed)
Pt did not arrive to video visit and did not answer the phone ; Left HIPPA-compliant message to call back Keyana Guevara from [facility] at  [phone] (Akiko Schexnider's office).    

## 2021-02-07 ENCOUNTER — Ambulatory Visit (INDEPENDENT_AMBULATORY_CARE_PROVIDER_SITE_OTHER): Payer: Medicaid Other | Admitting: Obstetrics and Gynecology

## 2021-02-07 ENCOUNTER — Encounter: Payer: Self-pay | Admitting: Obstetrics and Gynecology

## 2021-02-07 ENCOUNTER — Other Ambulatory Visit: Payer: Self-pay

## 2021-02-07 ENCOUNTER — Ambulatory Visit: Payer: Medicaid Other | Admitting: *Deleted

## 2021-02-07 ENCOUNTER — Ambulatory Visit (INDEPENDENT_AMBULATORY_CARE_PROVIDER_SITE_OTHER): Payer: Medicaid Other

## 2021-02-07 VITALS — BP 126/87 | HR 87 | Wt 254.0 lb

## 2021-02-07 DIAGNOSIS — O24415 Gestational diabetes mellitus in pregnancy, controlled by oral hypoglycemic drugs: Secondary | ICD-10-CM

## 2021-02-07 DIAGNOSIS — O0993 Supervision of high risk pregnancy, unspecified, third trimester: Secondary | ICD-10-CM | POA: Diagnosis not present

## 2021-02-07 DIAGNOSIS — Z98891 History of uterine scar from previous surgery: Secondary | ICD-10-CM

## 2021-02-07 DIAGNOSIS — O99210 Obesity complicating pregnancy, unspecified trimester: Secondary | ICD-10-CM | POA: Diagnosis not present

## 2021-02-07 DIAGNOSIS — F32A Depression, unspecified: Secondary | ICD-10-CM | POA: Diagnosis not present

## 2021-02-07 DIAGNOSIS — Z3A38 38 weeks gestation of pregnancy: Secondary | ICD-10-CM

## 2021-02-07 DIAGNOSIS — O0990 Supervision of high risk pregnancy, unspecified, unspecified trimester: Secondary | ICD-10-CM

## 2021-02-07 DIAGNOSIS — O99340 Other mental disorders complicating pregnancy, unspecified trimester: Secondary | ICD-10-CM

## 2021-02-07 NOTE — Progress Notes (Signed)
Subjective:  Beverly Roy is a 25 y.o. G2P0102 at [redacted]w[redacted]d being seen today for ongoing prenatal care.  She is currently monitored for the following issues for this high-risk pregnancy and has Asthma; ADHD; Gestational diabetes mellitus (GDM), antepartum; Supervision of high risk pregnancy, antepartum; History of cesarean section; History of fetal anomaly in prior pregnancy, currently pregnant; and Depression affecting pregnancy on their problem list.  Patient reports general discomforts of pregnancy.  Contractions: Irregular. Vag. Bleeding: None.  Movement: Present. Denies leaking of fluid.   The following portions of the patient's history were reviewed and updated as appropriate: allergies, current medications, past family history, past medical history, past social history, past surgical history and problem list. Problem list updated.  Objective:   Vitals:   02/07/21 1427  BP: 126/87  Pulse: 87  Weight: 254 lb (115.2 kg)    Fetal Status: Fetal Heart Rate (bpm): RNST   Movement: Present     General:  Alert, oriented and cooperative. Patient is in no acute distress.  Skin: Skin is warm and dry. No rash noted.   Cardiovascular: Normal heart rate noted  Respiratory: Normal respiratory effort, no problems with respiration noted  Abdomen: Soft, gravid, appropriate for gestational age. Pain/Pressure: Present     Pelvic:  Cervical exam deferred        Extremities: Normal range of motion.     Mental Status: Normal mood and affect. Normal behavior. Normal judgment and thought content.   Urinalysis:      Assessment and Plan:  Pregnancy: G2P0102 at [redacted]w[redacted]d  1. Supervision of high risk pregnancy in third trimester Stable Labor precautions For repeat C section on 02/13/21  2. Gestational diabetes mellitus (GDM) controlled on oral hypoglycemic drug, antepartum CBG's in goal range as per pt. Did not bring readings Continue with Metformin BPP 10/10 today  3. History of cesarean section See  above  4. Obesity in pregnancy, antepartum Stable   5. Depression affecting pregnancy Stable Continue with Zoloft 25 mg qd  Term labor symptoms and general obstetric precautions including but not limited to vaginal bleeding, contractions, leaking of fluid and fetal movement were reviewed in detail with the patient. Please refer to After Visit Summary for other counseling recommendations.  Return for C/S on 7/25, 1 week incision check afterwards, 4 weeks for PP visit.   Hermina Staggers, MD

## 2021-02-07 NOTE — Patient Instructions (Signed)
Cesarean Delivery, Care After This sheet gives you information about how to care for yourself after your procedure. Your health care provider may also give you more specific instructions. If you have problems or questions, contact your health careprovider. What can I expect after the procedure? After the procedure, it is common to have: A small amount of blood or clear fluid coming from the incision. Some redness, swelling, and pain in your incision area. Some abdominal pain and soreness. Vaginal bleeding (lochia). Even though you did not have a vaginal delivery, you will still have vaginal bleeding and discharge. Pelvic cramps. Fatigue. You may have pain, swelling, and discomfort in the tissue between your vagina and your anus (perineum) if: Your C-section was unplanned, and you were allowed to labor and push. An incision was made in the area (episiotomy) or the tissue tore during attempted vaginal delivery. Follow these instructions at home: Incision care  Follow instructions from your health care provider about how to take care of your incision. Make sure you: Wash your hands with soap and water before you change your bandage (dressing). If soap and water are not available, use hand sanitizer. If you have a dressing, change it or remove it as told by your health care provider. Leave stitches (sutures), skin staples, skin glue, or adhesive strips in place. These skin closures may need to stay in place for 2 weeks or longer. If adhesive strip edges start to loosen and curl up, you may trim the loose edges. Do not remove adhesive strips completely unless your health care provider tells you to do that. Check your incision area every day for signs of infection. Check for: More redness, swelling, or pain. More fluid or blood. Warmth. Pus or a bad smell. Do not take baths, swim, or use a hot tub until your health care provider says it's okay. Ask your health care provider if you can take  showers. When you cough or sneeze, hug a pillow. This helps with pain and decreases the chance of your incision opening up (dehiscing). Do this until your incision heals.  Medicines Take over-the-counter and prescription medicines only as told by your health care provider. If you were prescribed an antibiotic medicine, take it as told by your health care provider. Do not stop taking the antibiotic even if you start to feel better. Do not drive or use heavy machinery while taking prescription pain medicine. Lifestyle Do not drink alcohol. This is especially important if you are breastfeeding or taking pain medicine. Do not use any products that contain nicotine or tobacco, such as cigarettes, e-cigarettes, and chewing tobacco. If you need help quitting, ask your health care provider. Eating and drinking Drink at least 8 eight-ounce glasses of water every day unless told not to by your health care provider. If you breastfeed, you may need to drink even more water. Eat high-fiber foods every day. These foods may help prevent or relieve constipation. High-fiber foods include: Whole grain cereals and breads. Brown rice. Beans. Fresh fruits and vegetables. Activity  If possible, have someone help you care for your baby and help with household activities for at least a few days after you leave the hospital. Return to your normal activities as told by your health care provider. Ask your health care provider what activities are safe for you. Rest as much as possible. Try to rest or take a nap while your baby is sleeping. Do not lift anything that is heavier than 10 lbs (4.5 kg), or the   limit that you were told, until your health care provider says that it is safe. Talk with your health care provider about when you can engage in sexual activity. This may depend on your: Risk of infection. How fast you heal. Comfort and desire to engage in sexual activity.  General instructions Do not use tampons  or douches until your health care provider approves. Wear loose, comfortable clothing and a supportive and well-fitting bra. Keep your perineum clean and dry. Wipe from front to back when you use the toilet. If you pass a blood clot, save it and call your health care provider to discuss. Do not flush blood clots down the toilet before you get instructions from your health care provider. Keep all follow-up visits for you and your baby as told by your health care provider. This is important. Contact a health care provider if: You have: A fever. Bad-smelling vaginal discharge. Pus or a bad smell coming from your incision. Difficulty or pain when urinating. A sudden increase or decrease in the frequency of your bowel movements. More redness, swelling, or pain around your incision. More fluid or blood coming from your incision. A rash. Nausea. Little or no interest in activities you used to enjoy. Questions about caring for yourself or your baby. Your incision feels warm to the touch. Your breasts turn red or become painful or hard. You feel unusually sad or worried. You vomit. You pass a blood clot from your vagina. You urinate more than usual. You are dizzy or light-headed. Get help right away if: You have: Pain that does not go away or get better with medicine. Chest pain. Difficulty breathing. Blurred vision or spots in your vision. Thoughts about hurting yourself or your baby. New pain in your abdomen or in one of your legs. A severe headache. You faint. You bleed from your vagina so much that you fill more than one sanitary pad in one hour. Bleeding should not be heavier than your heaviest period. Summary After the procedure, it is common to have pain at your incision site, abdominal cramping, and slight bleeding from your vagina. Check your incision area every day for signs of infection. Tell your health care provider about any unusual symptoms. Keep all follow-up visits for  you and your baby as told by your health care provider. This information is not intended to replace advice given to you by your health care provider. Make sure you discuss any questions you have with your healthcare provider. Document Revised: 01/14/2018 Document Reviewed: 01/14/2018 Elsevier Patient Education  2022 [employer]  

## 2021-02-07 NOTE — Progress Notes (Signed)
Rpt C/S scheduled on 7/25

## 2021-02-08 ENCOUNTER — Ambulatory Visit: Payer: Medicaid Other | Admitting: Clinical

## 2021-02-08 DIAGNOSIS — Z5329 Procedure and treatment not carried out because of patient's decision for other reasons: Secondary | ICD-10-CM

## 2021-02-08 DIAGNOSIS — Z91199 Patient's noncompliance with other medical treatment and regimen due to unspecified reason: Secondary | ICD-10-CM

## 2021-02-09 ENCOUNTER — Encounter (HOSPITAL_COMMUNITY)
Admission: RE | Admit: 2021-02-09 | Discharge: 2021-02-09 | Disposition: A | Discharge status: Home / Self Care | Payer: Medicaid Other | Source: Ambulatory Visit | Attending: Family Medicine | Admitting: Family Medicine

## 2021-02-09 ENCOUNTER — Other Ambulatory Visit (HOSPITAL_COMMUNITY): Payer: Medicaid Other

## 2021-02-09 HISTORY — DX: Gestational diabetes mellitus in pregnancy, unspecified control: O24.419

## 2021-02-10 NOTE — Anesthesia Preprocedure Evaluation (Addendum)
Anesthesia Evaluation  Patient identified by MRN, date of birth, ID band Patient awake    Reviewed: Allergy & Precautions, NPO status , Patient's Chart, lab work & pertinent test results  History of Anesthesia Complications Negative for: history of anesthetic complications  Airway Mallampati: II  TM Distance: >3 FB Neck ROM: Full    Dental no notable dental hx.    Pulmonary asthma , former smoker,    Pulmonary exam normal        Cardiovascular hypertension, Normal cardiovascular exam     Neuro/Psych Depression    GI/Hepatic negative GI ROS, Neg liver ROS,   Endo/Other  diabetes, Type 2, Oral Hypoglycemic AgentsMorbid obesity  Renal/GU negative Renal ROS  negative genitourinary   Musculoskeletal negative musculoskeletal ROS (+)   Abdominal   Peds  Hematology negative hematology ROS (+)   Anesthesia Other Findings Day of surgery medications reviewed with patient.  Reproductive/Obstetrics (+) Pregnancy (Hx of C/S x1)                            Anesthesia Physical Anesthesia Plan  ASA: 3  Anesthesia Plan: Spinal   Post-op Pain Management:    Induction:   PONV Risk Score and Plan: 4 or greater and Treatment may vary due to age or medical condition, Ondansetron and Dexamethasone  Airway Management Planned: Natural Airway  Additional Equipment: None  Intra-op Plan:   Post-operative Plan:   Informed Consent: I have reviewed the patients History and Physical, chart, labs and discussed the procedure including the risks, benefits and alternatives for the proposed anesthesia with the patient or authorized representative who has indicated his/her understanding and acceptance.       Plan Discussed with: CRNA  Anesthesia Plan Comments:        Anesthesia Quick Evaluation

## 2021-02-12 ENCOUNTER — Encounter (HOSPITAL_COMMUNITY): Payer: Self-pay | Admitting: Family Medicine

## 2021-02-12 ENCOUNTER — Encounter (HOSPITAL_COMMUNITY)
Admission: AD | Disposition: A | Discharge status: Home / Self Care | Payer: Self-pay | Source: Home / Self Care | Attending: Family Medicine

## 2021-02-12 ENCOUNTER — Inpatient Hospital Stay (HOSPITAL_COMMUNITY)
Admission: AD | Admit: 2021-02-12 | Discharge: 2021-02-14 | DRG: 788 | Disposition: A | Discharge status: Home / Self Care | Payer: Medicaid Other | Attending: Family Medicine | Admitting: Family Medicine

## 2021-02-12 ENCOUNTER — Inpatient Hospital Stay (HOSPITAL_COMMUNITY): Admission: AD | Admit: 2021-02-12 | Payer: Medicaid Other | Source: Home / Self Care | Admitting: Family Medicine

## 2021-02-12 ENCOUNTER — Inpatient Hospital Stay (HOSPITAL_COMMUNITY): Payer: Medicaid Other | Admitting: Anesthesiology

## 2021-02-12 ENCOUNTER — Encounter (HOSPITAL_COMMUNITY): Admission: AD | Payer: Self-pay | Source: Home / Self Care

## 2021-02-12 DIAGNOSIS — O99214 Obesity complicating childbirth: Secondary | ICD-10-CM | POA: Diagnosis present

## 2021-02-12 DIAGNOSIS — R000 Tachycardia, unspecified: Secondary | ICD-10-CM | POA: Diagnosis not present

## 2021-02-12 DIAGNOSIS — Z87891 Personal history of nicotine dependence: Secondary | ICD-10-CM

## 2021-02-12 DIAGNOSIS — F32A Depression, unspecified: Secondary | ICD-10-CM | POA: Diagnosis not present

## 2021-02-12 DIAGNOSIS — O99340 Other mental disorders complicating pregnancy, unspecified trimester: Secondary | ICD-10-CM | POA: Diagnosis present

## 2021-02-12 DIAGNOSIS — Z20822 Contact with and (suspected) exposure to covid-19: Secondary | ICD-10-CM | POA: Diagnosis present

## 2021-02-12 DIAGNOSIS — O9902 Anemia complicating childbirth: Secondary | ICD-10-CM | POA: Diagnosis not present

## 2021-02-12 DIAGNOSIS — O134 Gestational [pregnancy-induced] hypertension without significant proteinuria, complicating childbirth: Secondary | ICD-10-CM | POA: Diagnosis present

## 2021-02-12 DIAGNOSIS — Z30017 Encounter for initial prescription of implantable subdermal contraceptive: Secondary | ICD-10-CM | POA: Diagnosis not present

## 2021-02-12 DIAGNOSIS — O24425 Gestational diabetes mellitus in childbirth, controlled by oral hypoglycemic drugs: Secondary | ICD-10-CM | POA: Diagnosis not present

## 2021-02-12 DIAGNOSIS — J45909 Unspecified asthma, uncomplicated: Secondary | ICD-10-CM | POA: Diagnosis not present

## 2021-02-12 DIAGNOSIS — O24419 Gestational diabetes mellitus in pregnancy, unspecified control: Secondary | ICD-10-CM | POA: Diagnosis present

## 2021-02-12 DIAGNOSIS — O99893 Other specified diseases and conditions complicating puerperium: Secondary | ICD-10-CM | POA: Diagnosis present

## 2021-02-12 DIAGNOSIS — Z3A39 39 weeks gestation of pregnancy: Secondary | ICD-10-CM

## 2021-02-12 DIAGNOSIS — O99344 Other mental disorders complicating childbirth: Secondary | ICD-10-CM | POA: Diagnosis not present

## 2021-02-12 DIAGNOSIS — O0990 Supervision of high risk pregnancy, unspecified, unspecified trimester: Secondary | ICD-10-CM

## 2021-02-12 DIAGNOSIS — E119 Type 2 diabetes mellitus without complications: Secondary | ICD-10-CM | POA: Diagnosis present

## 2021-02-12 DIAGNOSIS — O9952 Diseases of the respiratory system complicating childbirth: Secondary | ICD-10-CM | POA: Diagnosis not present

## 2021-02-12 DIAGNOSIS — Z98891 History of uterine scar from previous surgery: Secondary | ICD-10-CM

## 2021-02-12 DIAGNOSIS — O34211 Maternal care for low transverse scar from previous cesarean delivery: Secondary | ICD-10-CM | POA: Diagnosis not present

## 2021-02-12 DIAGNOSIS — J4520 Mild intermittent asthma, uncomplicated: Secondary | ICD-10-CM

## 2021-02-12 DIAGNOSIS — O24415 Gestational diabetes mellitus in pregnancy, controlled by oral hypoglycemic drugs: Secondary | ICD-10-CM

## 2021-02-12 LAB — COMPREHENSIVE METABOLIC PANEL
ALT: 14 U/L (ref 0–44)
AST: 17 U/L (ref 15–41)
Albumin: 2.7 g/dL — ABNORMAL LOW (ref 3.5–5.0)
Alkaline Phosphatase: 195 U/L — ABNORMAL HIGH (ref 38–126)
Anion gap: 8 (ref 5–15)
BUN: 8 mg/dL (ref 6–20)
CO2: 21 mmol/L — ABNORMAL LOW (ref 22–32)
Calcium: 9.1 mg/dL (ref 8.9–10.3)
Chloride: 106 mmol/L (ref 98–111)
Creatinine, Ser: 0.63 mg/dL (ref 0.44–1.00)
GFR, Estimated: >60 mL/min (ref >60)
Glucose, Bld: 110 mg/dL — ABNORMAL HIGH (ref 70–99)
Potassium: 4 mmol/L (ref 3.5–5.1)
Sodium: 135 mmol/L (ref 135–145)
Total Bilirubin: 0.7 mg/dL (ref 0.3–1.2)
Total Protein: 6.2 g/dL — ABNORMAL LOW (ref 6.5–8.1)

## 2021-02-12 LAB — CBC
HCT: 35.7 % — ABNORMAL LOW (ref 36.0–46.0)
Hemoglobin: 11.4 g/dL — ABNORMAL LOW (ref 12.0–15.0)
MCH: 26 pg (ref 26.0–34.0)
MCHC: 31.9 g/dL (ref 30.0–36.0)
MCV: 81.5 fL (ref 80.0–100.0)
Platelets: 250 10*3/uL (ref 150–400)
RBC: 4.38 MIL/uL (ref 3.87–5.11)
RDW: 14.3 % (ref 11.5–15.5)
WBC: 8.1 10*3/uL (ref 4.0–10.5)
nRBC: 0 % (ref 0.0–0.2)

## 2021-02-12 LAB — GLUCOSE, CAPILLARY
Glucose-Capillary: 117 mg/dL — ABNORMAL HIGH (ref 70–99)
Glucose-Capillary: 102 mg/dL — ABNORMAL HIGH (ref 70–99)

## 2021-02-12 LAB — RPR: RPR Ser Ql: NONREACTIVE

## 2021-02-12 LAB — TYPE AND SCREEN
ABO/RH(D): AB POS
Antibody Screen: NEGATIVE

## 2021-02-12 LAB — RESP PANEL BY RT-PCR (FLU A&B, COVID) ARPGX2
Influenza A by PCR: NEGATIVE
Influenza B by PCR: NEGATIVE
SARS Coronavirus 2 by RT PCR: NEGATIVE

## 2021-02-12 SURGERY — Surgical Case
Anesthesia: Regional

## 2021-02-12 SURGERY — Surgical Case
Anesthesia: Spinal

## 2021-02-12 MED ORDER — ONDANSETRON HCL 4 MG/2ML IJ SOLN
INTRAMUSCULAR | Status: DC | PRN
  Administered 2021-02-12: 4 mg via INTRAVENOUS

## 2021-02-12 MED ORDER — OXYTOCIN-SODIUM CHLORIDE 30-0.9 UT/500ML-% IV SOLN
INTRAVENOUS | Status: DC | PRN
  Administered 2021-02-12: 300 mL via INTRAVENOUS

## 2021-02-12 MED ORDER — NIFEDIPINE ER OSMOTIC RELEASE 30 MG PO TB24
30.0000 mg | ORAL_TABLET | Freq: Every day | ORAL | Status: DC
  Administered 2021-02-12 – 2021-02-14 (×3): 30 mg via ORAL
  Filled 2021-02-12 (×3): qty 1

## 2021-02-12 MED ORDER — FENTANYL CITRATE (PF) 250 MCG/5ML IJ SOLN
INTRAMUSCULAR | Status: AC
  Filled 2021-02-12: qty 5

## 2021-02-12 MED ORDER — BUPIVACAINE IN DEXTROSE 0.75-8.25 % IT SOLN
INTRATHECAL | Status: DC | PRN
Start: 2021-02-12 — End: 2021-02-12
  Administered 2021-02-12: 1.8 mg via INTRATHECAL

## 2021-02-12 MED ORDER — PRENATAL MULTIVITAMIN CH
1.0000 | ORAL_TABLET | Freq: Every day | ORAL | Status: DC
  Administered 2021-02-13 – 2021-02-14 (×2): 1 via ORAL
  Filled 2021-02-12 (×2): qty 1

## 2021-02-12 MED ORDER — OXYTOCIN-SODIUM CHLORIDE 30-0.9 UT/500ML-% IV SOLN: 2.5000 [IU]/h | INTRAVENOUS | Status: AC

## 2021-02-12 MED ORDER — SIMETHICONE 80 MG PO CHEW: 80.0000 mg | CHEWABLE_TABLET | ORAL | Status: DC | PRN

## 2021-02-12 MED ORDER — PHENYLEPHRINE HCL-NACL 20-0.9 MG/250ML-% IV SOLN
INTRAVENOUS | Status: DC | PRN
  Administered 2021-02-12: 60 ug/min via INTRAVENOUS

## 2021-02-12 MED ORDER — MORPHINE SULFATE (PF) 0.5 MG/ML IJ SOLN
INTRAMUSCULAR | Status: DC | PRN
  Administered 2021-02-12: .15 mg via INTRATHECAL

## 2021-02-12 MED ORDER — DIBUCAINE (PERIANAL) 1 % EX OINT: 1.0000 "application " | TOPICAL_OINTMENT | CUTANEOUS | Status: DC | PRN

## 2021-02-12 MED ORDER — POVIDONE-IODINE 10 % EX SWAB
2.0000 | Freq: Once | CUTANEOUS | Status: AC
Start: 2021-02-12 — End: 2021-02-12
  Administered 2021-02-12: 2 via TOPICAL

## 2021-02-12 MED ORDER — IBUPROFEN 600 MG PO TABS
600.0000 mg | ORAL_TABLET | Freq: Four times a day (QID) | ORAL | Status: DC
  Administered 2021-02-13 – 2021-02-14 (×4): 600 mg via ORAL
  Filled 2021-02-12 (×4): qty 1

## 2021-02-12 MED ORDER — COCONUT OIL OIL: 1.0000 "application " | TOPICAL_OIL | Status: DC | PRN

## 2021-02-12 MED ORDER — FENTANYL CITRATE (PF) 100 MCG/2ML IJ SOLN
25.0000 ug | INTRAMUSCULAR | Status: DC | PRN
  Administered 2021-02-12: 50 ug via INTRAVENOUS

## 2021-02-12 MED ORDER — ACETAMINOPHEN 500 MG PO TABS: 1000.0000 mg | ORAL_TABLET | Freq: Once | ORAL | Status: DC

## 2021-02-12 MED ORDER — KETOROLAC TROMETHAMINE 30 MG/ML IJ SOLN: 30.0000 mg | Freq: Four times a day (QID) | INTRAMUSCULAR | Status: DC | PRN

## 2021-02-12 MED ORDER — SOD CITRATE-CITRIC ACID 500-334 MG/5ML PO SOLN
30.0000 mL | ORAL | Status: AC
  Administered 2021-02-12: 30 mL via ORAL

## 2021-02-12 MED ORDER — HYDROCODONE-ACETAMINOPHEN 5-325 MG PO TABS
1.0000 | ORAL_TABLET | ORAL | Status: DC | PRN
  Administered 2021-02-13 – 2021-02-14 (×4): 1 via ORAL
  Filled 2021-02-12 (×4): qty 1

## 2021-02-12 MED ORDER — SENNOSIDES-DOCUSATE SODIUM 8.6-50 MG PO TABS
2.0000 | ORAL_TABLET | ORAL | Status: DC
  Administered 2021-02-13 – 2021-02-14 (×2): 2 via ORAL
  Filled 2021-02-12 (×2): qty 2

## 2021-02-12 MED ORDER — SIMETHICONE 80 MG PO CHEW
80.0000 mg | CHEWABLE_TABLET | Freq: Three times a day (TID) | ORAL | Status: DC
  Administered 2021-02-12 – 2021-02-14 (×4): 80 mg via ORAL
  Filled 2021-02-12 (×5): qty 1

## 2021-02-12 MED ORDER — NALBUPHINE HCL 10 MG/ML IJ SOLN
5.0000 mg | INTRAMUSCULAR | Status: DC | PRN
  Administered 2021-02-12: 5 mg via INTRAVENOUS
  Filled 2021-02-12 (×2): qty 0.5

## 2021-02-12 MED ORDER — ONDANSETRON HCL 4 MG/2ML IJ SOLN
INTRAMUSCULAR | Status: AC
  Filled 2021-02-12: qty 2

## 2021-02-12 MED ORDER — ENOXAPARIN SODIUM 60 MG/0.6ML IJ SOSY
60.0000 mg | PREFILLED_SYRINGE | INTRAMUSCULAR | Status: DC
  Administered 2021-02-13 – 2021-02-14 (×2): 60 mg via SUBCUTANEOUS
  Filled 2021-02-12 (×2): qty 0.6

## 2021-02-12 MED ORDER — PROMETHAZINE HCL 25 MG/ML IJ SOLN
6.2500 mg | INTRAMUSCULAR | Status: DC | PRN
  Administered 2021-02-12: 6.25 mg via INTRAVENOUS

## 2021-02-12 MED ORDER — CEFAZOLIN SODIUM-DEXTROSE 2-4 GM/100ML-% IV SOLN
2.0000 g | INTRAVENOUS | Status: AC
  Administered 2021-02-12: 2 g via INTRAVENOUS

## 2021-02-12 MED ORDER — WITCH HAZEL-GLYCERIN EX PADS: 1.0000 "application " | MEDICATED_PAD | CUTANEOUS | Status: DC | PRN

## 2021-02-12 MED ORDER — PROPOFOL 10 MG/ML IV BOLUS
INTRAVENOUS | Status: AC
  Filled 2021-02-12: qty 20

## 2021-02-12 MED ORDER — FENTANYL CITRATE (PF) 100 MCG/2ML IJ SOLN
INTRAMUSCULAR | Status: DC | PRN
  Administered 2021-02-12: 15 ug via INTRATHECAL

## 2021-02-12 MED ORDER — LACTATED RINGERS IV SOLN: INTRAVENOUS | Status: DC

## 2021-02-12 MED ORDER — DIPHENHYDRAMINE HCL 25 MG PO CAPS
25.0000 mg | ORAL_CAPSULE | Freq: Four times a day (QID) | ORAL | Status: DC | PRN
  Administered 2021-02-13: 25 mg via ORAL
  Filled 2021-02-12: qty 1

## 2021-02-12 MED ORDER — ACETAMINOPHEN 160 MG/5ML PO SOLN: 1000.0000 mg | Freq: Once | ORAL | Status: DC

## 2021-02-12 MED ORDER — MORPHINE SULFATE (PF) 0.5 MG/ML IJ SOLN
INTRAMUSCULAR | Status: AC
  Filled 2021-02-12: qty 10

## 2021-02-12 MED ORDER — KETOROLAC TROMETHAMINE 30 MG/ML IJ SOLN
30.0000 mg | Freq: Four times a day (QID) | INTRAMUSCULAR | Status: AC
  Administered 2021-02-12 – 2021-02-13 (×3): 30 mg via INTRAVENOUS
  Filled 2021-02-12 (×3): qty 1

## 2021-02-12 MED ORDER — MENTHOL 3 MG MT LOZG: 1.0000 | LOZENGE | OROMUCOSAL | Status: DC | PRN

## 2021-02-12 MED ORDER — KETOROLAC TROMETHAMINE 30 MG/ML IJ SOLN
30.0000 mg | Freq: Once | INTRAMUSCULAR | Status: AC
  Administered 2021-02-12: 30 mg via INTRAVENOUS

## 2021-02-12 SURGICAL SUPPLY — 30 items
APL PRP STRL LF DISP 70% ISPRP (MISCELLANEOUS) ×2
APL SKNCLS STERI-STRIP NONHPOA (GAUZE/BANDAGES/DRESSINGS) ×1
BENZOIN TINCTURE PRP APPL 2/3 (GAUZE/BANDAGES/DRESSINGS) ×1 IMPLANT
CHLORAPREP W/TINT 26 (MISCELLANEOUS) ×2 IMPLANT
CLAMP CORD UMBIL (MISCELLANEOUS) ×1 IMPLANT
CLOTH BEACON ORANGE TIMEOUT ST (SAFETY) ×1 IMPLANT
DRSG OPSITE POSTOP 4X10 (GAUZE/BANDAGES/DRESSINGS) ×2 IMPLANT
DRSG PAD ABDOMINAL 8X10 ST (GAUZE/BANDAGES/DRESSINGS) ×1 IMPLANT
ELECT REM PT RETURN 9FT ADLT (ELECTROSURGICAL) ×2
ELECTRODE REM PT RTRN 9FT ADLT (ELECTROSURGICAL) IMPLANT
GOWN STRL REUS W/TWL LRG LVL3 (GOWN DISPOSABLE) ×3 IMPLANT
HEMOSTAT ARISTA ABSORB 3G PWDR (HEMOSTASIS) ×1 IMPLANT
NS IRRIG 1000ML POUR BTL (IV SOLUTION) ×1 IMPLANT
PACK C SECTION WH (CUSTOM PROCEDURE TRAY) ×1 IMPLANT
PAD ABD DERMACEA PRESS 5X9 (GAUZE/BANDAGES/DRESSINGS) ×1 IMPLANT
PAD OB MATERNITY 4.3X12.25 (PERSONAL CARE ITEMS) ×1 IMPLANT
PENCIL SMOKE EVAC W/HOLSTER (ELECTROSURGICAL) ×1 IMPLANT
RTRCTR C-SECT PINK 25CM LRG (MISCELLANEOUS) ×1 IMPLANT
STRIP SURGICAL 1/2 X 6 IN (GAUZE/BANDAGES/DRESSINGS) ×1 IMPLANT
SUT MNCRL 0 VIOLET CTX 36 (SUTURE) IMPLANT
SUT MON AB 3-0 SH 27 (SUTURE) ×2
SUT MON AB 3-0 SH27 (SUTURE) IMPLANT
SUT MONOCRYL 0 CTX 36 (SUTURE) ×6
SUT VIC AB 0 CTX 36 (SUTURE) ×2
SUT VIC AB 0 CTX36XBRD ANBCTRL (SUTURE) IMPLANT
SUT VIC AB 2-0 CT1 27 (SUTURE) ×2
SUT VIC AB 2-0 CT1 TAPERPNT 27 (SUTURE) IMPLANT
SUT VIC AB 4-0 KS 27 (SUTURE) ×1 IMPLANT
TRAY FOLEY W/BAG SLVR 14FR LF (SET/KITS/TRAYS/PACK) ×1 IMPLANT
WATER STERILE IRR 1000ML POUR (IV SOLUTION) ×1 IMPLANT

## 2021-02-12 NOTE — Op Note (Signed)
Operative Note   Patient: Beverly Roy  Date of Procedure: 02/12/2021  Procedure: Repeat Low Transverse Cesarean   Indications: patient declines vag del attempt, elective repeat, previous uterine incision: low transverse  Pre-operative Diagnosis: repeat cesarean section.   Post-operative Diagnosis: Same  TOLAC Candidate: No  Surgeon: Surgeon(s) and Role:    * Venora Maples, MD - Primary  Assistants: Dr. Leticia Penna - OB Fellow  An experienced assistant was required given the standard of surgical care given the complexity of the case.  This assistant was needed for exposure, dissection, suctioning, retraction, instrument exchange, assisting with delivery with administration of fundal pressure, and for overall help during the procedure.   Anesthesia: spinal  Anesthesiologist: No responsible provider has been recorded for the case.   Antibiotics: Cefazolin   Estimated Blood Loss: 740 ml   Total IV Fluids: 1500 ml  Urine Output:  200 cc OF clear urine  Specimens: none   Complications: no complications   Indications: Beverly Roy is a 25 y.o. [phone] with an IUP [redacted]w[redacted]d presenting for scheduled cesarean secondary to the indications listed above.  The risks of cesarean section discussed with the patient included but were not limited to: bleeding which may require transfusion or reoperation; infection which may require antibiotics; injury to bowel, bladder, ureters or other surrounding organs; injury to the fetus; need for additional procedures including hysterectomy in the event of a life-threatening hemorrhage; placental abnormalities with subsequent pregnancies, incisional problems, thromboembolic phenomenon and other postoperative/anesthesia complications. The patient concurred with the proposed plan, giving informed written consent for the procedure. Patient has been NPO since last night she will remain NPO for procedure. Anesthesia and OR aware. Preoperative  prophylactic antibiotics and SCDs ordered on call to the OR.   Findings: Viable infant in cephalic presentation, nuchal x1. Apgars 8 , 9 , . Weight 3430 g . Clear amniotic fluid. Normal placenta, three vessel cord. Normal uterus, Normal bilateral fallopian tubes, Normal bilateral ovaries.  Procedure Details: A Time Out was held and the above information confirmed. The patient received intravenous antibiotics and had sequential compression devices applied to her lower extremities preoperatively. The patient was taken back to the operative suite where spinal anesthesia was administered. After induction of anesthesia, the patient was draped and prepped in the usual sterile manner and placed in a dorsal supine position with a leftward tilt. A low transverse skin incision was made with scalpel and carried down through the subcutaneous tissue to the fascia. Fascial incision was made and extended transversely. The fascia was separated from the underlying rectus tissue superiorly and inferiorly. The rectus muscles were separated in the midline bluntly and the peritoneum was entered bluntly. An Alexis retractor was placed to aid in visualization of the uterus. A bladder flap was not developed. A low transverse uterine incision was made, and the infant found to be in cephalic presentation. There was great difficulty in elevating the fetal head so a Kiwi vacuum was called for. There were three attempts with three pop offs. At that point the incision was widened on the R rectus muscles with bandage scissors as well as on the R side of the hysteretomy. After these interventions the infant was successfully delivered from cephalic presentation, the umbilical cord was clamped after 1 minute. Cord ph was not sent, and cord blood was obtained for evaluation. The placenta was removed Intact and appeared normal. The uterine incision was closed with running locked sutures of 0-Monocryl, and then a second imbricating layer was also  placed with 0-Monocryl. There was an area of persistent bleeding from the L corner of the hysterotomy somewhat superior and lateral and abutting some engorged vessels, so the uterus was removed and inspected. A 3-0 monocryl on SH was used to place a figure of eight suture with good hemostasis. After several minutes of observation there was no expanding hematoma or other signs of vessel injury, and the uterus was replaced in the abdomen. The abdomen was irrigated and the pelvis was cleared of all clots and debris. The hysterotomy was inspected and found to be slightly oozy but overall good hemostasis, so a layer of arista was placed. Hemostasis was confirmed on all surfaces.  The peritoneum was was not reapproximated. The fascia was then closed using 0 Vicryl in a running fashion. The subcutaneous layer was reapproximated with plain gut and the skin was closed with a 4-0 vicryl subcuticular stitch. The patient tolerated the procedure well. Sponge, lap, instrument and needle counts were correct x 2. She was taken to the recovery room in stable condition.  Disposition: PACU - hemodynamically stable.    Signed: Venora Maples, MD, MPH [facility])

## 2021-02-12 NOTE — Transfer of Care (Signed)
Immediate Anesthesia Transfer of Care Note  Patient: Beverly Roy  Procedure(s) Performed: CESAREAN SECTION WITH BILATERAL TUBAL LIGATION  Patient Location: PACU  Anesthesia Type:Spinal  Level of Consciousness: awake  Airway & Oxygen Therapy: Patient Spontanous Breathing  Post-op Assessment: Report given to RN  Post vital signs: Reviewed and stable  Last Vitals:  Vitals Value Taken Time  BP 127/74 02/12/21 1346  Temp    Pulse 82 02/12/21 1348  Resp 17 02/12/21 1348  SpO2 100 % 02/12/21 1348  Vitals shown include unvalidated device data.  Last Pain: There were no vitals filed for this visit.       Complications: No notable events documented.

## 2021-02-12 NOTE — Discharge Summary (Addendum)
Postpartum Discharge Summary     Patient Name: Beverly Roy DOB: 1996/03/17 MRN: [id number]  Date of admission: 02/12/2021 Delivery date:02/12/2021  Delivering provider: Clarnce Flock  Date of discharge: 02/14/2021  Admitting diagnosis: History of cesarean section [Z98.891] Cesarean delivery delivered [O82] Intrauterine pregnancy: [redacted]w[redacted]d    Secondary diagnosis:  Active Problems:   Gestational diabetes mellitus (GDM), antepartum   Supervision of high risk pregnancy, antepartum   History of cesarean section   Depression affecting pregnancy   Cesarean delivery delivered  Additional problems: gHTN, GDM    Discharge diagnosis: Term Pregnancy Delivered and GDM A2                                              Post partum procedures: nexplanon placement Augmentation: N/A Complications: None  Hospital course: Sceduled C/S   25y.o. yo G2P1103 at 338w3das admitted to the hospital 02/12/2021 for scheduled cesarean section with the following indication:Elective Repeat.Delivery details are as follows:  Membrane Rupture Time/Date: 12:40 PM ,02/12/2021   Delivery Method:C-Section, Low Transverse  Details of operation can be found in separate operative note.  Patient had an uncomplicated postpartum course.  She is ambulating, tolerating a regular diet, passing flatus, and urinating well. Patient is discharged home in stable condition on  02/14/21        Newborn Data: Birth date:02/12/2021  Birth time:12:41 PM  Gender:Female  Living status:Living  Apgars:8 ,9  Weight:3430 g     Magnesium Sulfate received: No BMZ received: No Rhophylac:N/A MMR:No T-DaP:Given prenatally Flu: N/A Transfusion:No  Physical exam  Vitals:   02/13/21 0654 02/13/21 1300 02/13/21 2203 02/14/21 0524  BP:  127/85 123/76 129/85  Pulse: (!) 119 95 99 84  Resp:  _0 Temp:  97.6 F (36.4 C) 98.7 F (37.1 C) 98.3 F (36.8 C)  TempSrc:  Oral Oral Oral  SpO2:  99% 100% 100%  Weight:      Height:        General: alert, cooperative, and no distress Lochia: appropriate Uterine Fundus: firm Incision: Healing well with no significant drainage, Dressing is clean, dry, and intact DVT Evaluation: No evidence of DVT seen on physical exam. Labs: Lab Results  Component Value Date   WBC 9.8 02/13/2021   HGB 8.9 (L) 02/13/2021   HCT 28.4 (L) 02/13/2021   MCV 82.1 02/13/2021   PLT 213 02/13/2021   CMP Latest Ref Rng & Units 02/12/2021  Glucose 70 - 99 mg/dL 110(H)  BUN 6 - 20 mg/dL 8  Creatinine 0.44 - 1.00 mg/dL 0.63  Sodium 135 - 145 mmol/L 135  Potassium 3.5 - 5.1 mmol/L 4.0  Chloride 98 - 111 mmol/L 106  CO2 22 - 32 mmol/L 21(L)  Calcium 8.9 - 10.3 mg/dL 9.1  Total Protein 6.5 - 8.1 g/dL 6.2(L)  Total Bilirubin 0.3 - 1.2 mg/dL 0.7  Alkaline Phos 38 - 126 U/L 195(H)  AST 15 - 41 U/L 17  ALT 0 - 44 U/L 14   Edinburgh Score: Edinburgh Postnatal Depression Scale Screening Tool 02/13/2021  I have been able to laugh and see the funny side of things. 0  I have looked forward with enjoyment to things. 3  I have blamed myself unnecessarily when things went wrong. 0  I have been anxious or worried for no good reason. 1  I have felt scared or  panicky for no good reason. 0  Things have been getting on top of me. 1  I have been so unhappy that I have had difficulty sleeping. 3  I have felt sad or miserable. 0  I have been so unhappy that I have been crying. 0  The thought of harming myself has occurred to me. 0  Edinburgh Postnatal Depression Scale Total 8     After visit meds:  Allergies as of 02/14/2021   No Known Allergies      Medication List     STOP taking these medications    Accu-Chek Guide test strip Generic drug: glucose blood   Accu-Chek Softclix Lancets lancets   Fifty50 Glucose Meter 2.0 w/Device Kit   metFORMIN 500 MG tablet Commonly known as: GLUCOPHAGE       TAKE these medications    acetaminophen 500 MG tablet Commonly known as: TYLENOL Take 1  tablet (500 mg total) by mouth every 6 (six) hours as needed.   HYDROcodone-acetaminophen 5-325 MG tablet Commonly known as: NORCO/VICODIN Take 1-2 tablets by mouth every 4 (four) hours as needed for moderate pain.   hydrOXYzine 25 MG tablet Commonly known as: ATARAX/VISTARIL Take 1 tablet (25 mg total) by mouth every 6 (six) hours as needed for anxiety.   ibuprofen 600 MG tablet Commonly known as: ADVIL Take 1 tablet (600 mg total) by mouth every 6 (six) hours.   NIFEdipine 30 MG 24 hr tablet Commonly known as: ADALAT CC Take 1 tablet (30 mg total) by mouth daily.   PNV PO Take by mouth.   Prenatal Vitamin 27-0.8 MG Tabs Take 1 tablet by mouth daily.   ProAir HFA 108 (90 Base) MCG/ACT inhaler Generic drug: albuterol SMARTSIG:2 Puff(s) By Mouth Every 4 Hours PRN   sertraline 25 MG tablet Commonly known as: ZOLOFT Take 1 tablet (25 mg total) by mouth daily. If no adverse sx in 1 week, increase to 56m (2 pills) daily.       ASK your doctor about these medications    raltegravir 400 MG tablet Commonly known as: ISENTRESS Take by mouth.         Discharge home in stable condition Infant Feeding: Bottle and Breast Infant Disposition:home with mother Discharge instruction: per After Visit Summary and Postpartum booklet. Activity: Advance as tolerated. Pelvic rest for 6 weeks.  Diet: routine diet Future Appointments: Future Appointments  Date Time [place] 02/19/2021 10:20 AM [facility] 03/12/2021 10:55 AM [place], TRona Ravens [facility]  [facility]:  [facility] Go in 1 week(s).   Specialty: Obstetrics and Gynecology Why: For wound re-check and BP check Contact information: [phone]                 Please schedule this patient for a In person postpartum visit in 4 weeks  with the following provider: Any provider. Additional Postpartum F/U:2 hour GTT and Incision check 1 week  High risk pregnancy complicated by: GDM Delivery mode:  C-Section, Low Transverse  Anticipated Birth Control:  Nexplanon   02/14/2021 MHansel Feinstein CNM

## 2021-02-12 NOTE — Anesthesia Postprocedure Evaluation (Signed)
Anesthesia Post Note  Patient: Beverly Roy  Procedure(s) Performed: CESAREAN SECTION WITH BILATERAL TUBAL LIGATION     Patient location during evaluation: PACU Anesthesia Type: Spinal Level of consciousness: awake and alert and oriented Pain management: pain level controlled Vital Signs Assessment: post-procedure vital signs reviewed and stable Respiratory status: spontaneous breathing, nonlabored ventilation and respiratory function stable Cardiovascular status: blood pressure returned to baseline Postop Assessment: no apparent nausea or vomiting, spinal receding, no headache and no backache Anesthetic complications: no   No notable events documented.  Last Vitals:  Vitals:   02/12/21 1458 02/12/21 1612  BP: (!) 138/93 (!) 139/96  Pulse:  79  Resp: 18 18  Temp: 36.6 C 36.6 C  SpO2:  99%    Last Pain:  Vitals:   02/12/21 1500  TempSrc:   PainSc: 3    Pain Goal:                   Kaylyn Layer

## 2021-02-12 NOTE — Anesthesia Procedure Notes (Signed)
Spinal  Patient location during procedure: OR Start time: 02/12/2021 12:02 PM End time: 02/12/2021 12:05 PM Reason for block: surgical anesthesia Staffing Performed: anesthesiologist  Anesthesiologist: Kaylyn Layer, MD Preanesthetic Checklist Completed: patient identified, IV checked, risks and benefits discussed, monitors and equipment checked, pre-op evaluation and timeout performed Spinal Block Patient position: sitting Prep: DuraPrep and site prepped and draped Patient monitoring: heart rate, continuous pulse ox and blood pressure Approach: midline Location: L3-4 Injection technique: single-shot Needle Needle type: Pencan  Needle gauge: 24 G Needle length: 10 cm Assessment Sensory level: T4 Events: CSF return Additional Notes Risks, benefits, and alternative discussed. Patient gave consent to procedure. Prepped and draped in sitting position. Clear CSF obtained after one needle pass. Positive terminal aspiration. No pain or paraesthesias with injection. Patient tolerated procedure well. Vital signs stable. Amalia Greenhouse, MD

## 2021-02-12 NOTE — H&P (Signed)
LABOR AND DELIVERY ADMISSION HISTORY AND PHYSICAL NOTE  Beverly Roy is a 25 y.o. female G27P0102 with IUP at 25w3dby 26wk [place] for elective repeat cesarean.   She reports positive fetal movement. She denies leakage of fluid or vaginal bleeding.   She plans on breast and bottle feeding. She requests Nexplanon for birth control.  [facility]ren[facility] History/Complications[facility]  Sono:  @[redacted]w[redacted]d , CWD, normal anatomy, cephalic presentation, anterior placenta, 75%ile, EFW [id number] Pregnancy complications:  - GDM on metformin - Depression  Past Medical History: Past Medical History:  Diagnosis Date   Asthma    Gestational diabetes    Hypertension     Past Surgical History: Past Surgical History:  Procedure Laterality Date   CESAREAN SECTION      Obstetrical History: OB History     Gravida  2   Para  1   Term      Preterm  1   AB      Living  2      SAB      IAB      Ectopic      Multiple  1   Live Births  2           Social History: Social History   Socioeconomic History   Marital status: Single    Spouse name: Not on file   Number of children: Not on file   Years of education: Not on file   Highest education level: Not on file  Occupational History   Not on file  Tobacco Use   Smoking status: Former    Types: Cigarettes    Quit date: 10/21/2016    Years since quitting: 4.3   Smokeless tobacco: Never  Vaping Use   Vaping Use: Never used  Substance and Sexual Activity   Alcohol use: No   Drug use: Yes    Types: Marijuana    Comment: quit with +UPT   Sexual activity: Yes    Birth control/protection: None  Other Topics Concern   Not on file  Social History Narrative   Not on file   Social Determinants of Health   Financial Resource Strain: Not on file  Food Insecurity: No Food Insecurity   Worried About Running Out of Food in the Last Year: Never true   Ran Out of Food in the Last Year: Never true   Transportation Needs: No Transportation Needs   Lack of Transportation (Medical): No   Lack of Transportation (Non-Medical): No  Physical Activity: Not on file  Stress: Not on file  Social Connections: Not on file    Family History: Family History  Problem Relation Age of Onset   Hypertension Father    Diabetes Maternal Aunt    Cancer Maternal Aunt    Diabetes Maternal Grandmother     Allergies: No Known Allergies  Medications Prior to Admission  Medication Sig Dispense Refill Last Dose   metFORMIN (GLUCOPHAGE) 500 MG tablet Take 2 tablets (1,000 mg total) by mouth 2 (two) times daily with a meal. 60 tablet 5 02/11/2021   Prenatal Vit w/Fe-Methylfol-FA (PNV PO) Take by mouth.   02/11/2021   Prenatal Vit-Fe Fumarate-FA (PRENATAL VITAMIN) 27-0.8 MG TABS Take 1 tablet by mouth daily. 90 tablet 1 02/11/2021   Accu-Chek Softclix Lancets lancets Use as instructed 100 each 12    acetaminophen (TYLENOL) 500 MG tablet Take 1 tablet (500 mg total) by mouth every 6 (six) hours as needed. (Patient not taking:  Reported on 02/07/2021) 30 tablet 0    Blood Glucose Monitoring Suppl (FIFTY50 GLUCOSE METER 2.0) w/Device KIT See admin instructions.      glucose blood (ACCU-CHEK GUIDE) test strip To check blood sugars 4 times a day. Fasting, and 2 hours after Breakfast, Lunch and Dinner 100 each 12    hydrOXYzine (ATARAX/VISTARIL) 25 MG tablet Take 1 tablet (25 mg total) by mouth every 6 (six) hours as needed for anxiety. (Patient not taking: Reported on 02/07/2021) 30 tablet 2    PROAIR HFA 108 (90 Base) MCG/ACT inhaler SMARTSIG:2 Puff(s) By Mouth Every 4 Hours PRN (Patient not taking: Reported on 02/07/2021)      raltegravir (ISENTRESS) 400 MG tablet Take by mouth. (Patient not taking: No sig reported)      sertraline (ZOLOFT) 25 MG tablet Take 1 tablet (25 mg total) by mouth daily. If no adverse sx in 1 week, increase to 11m (2 pills) daily. (Patient not taking: Reported on 02/07/2021) 45 tablet 3       Review of Systems  All systems reviewed and negative except as stated in HPI  Physical Exam Blood pressure (!) 146/99, pulse 81, temperature 98.6 F (37 C), resp. rate 18, height 5' 2"  (1.575 m), weight 115.2 kg, last menstrual period 04/21/2020, SpO2 100 %. General appearance: alert, oriented, NAD Lungs: normal respiratory effort Heart: regular rate Abdomen: soft, non-tender; gravid Extremities: No calf swelling or tenderness FHR: 158 bpm by doppler  Prenatal labs: ABO, Rh: --/--/AB POS (07/25 0725) Antibody: NEG (07/25 0725) Rubella:   RPR: Non Reactive (04/13 1110)  HBsAg: Negative (05/11 1404)  HIV: Non Reactive (04/13 1110)  GC/Chlamydia: neg/neg  GBS: Negative/-- (07/14 1626)  2-hr GTT: abnormal Genetic screening:  not done Anatomy [place] normal  Prenatal Transfer Tool  Maternal Diabetes: Yes:  Diabetes Type:  Insulin/Medication controlled Genetic Screening: Declined Maternal Ultrasounds/Referrals: Normal Fetal Ultrasounds or other Referrals:  None Maternal Substance Abuse:  No Significant Maternal Medications:  Meds include: Zoloft Other: metformin Significant Maternal Lab Results: Group B Strep negative  Results for orders placed or performed during the hospital encounter of 02/12/21 (from the past 24 hour(s))  Resp Panel by RT-PCR (Flu A&B, Covid) Nasopharyngeal Swab   Collection Time: 02/12/21  6:57 AM   Specimen: Nasopharyngeal Swab; Nasopharyngeal(NP) swabs in vial transport medium  Result Value Ref Range   SARS Coronavirus 2 by RT PCR NEGATIVE NEGATIVE   Influenza A by PCR NEGATIVE NEGATIVE   Influenza B by PCR NEGATIVE NEGATIVE  CBC   Collection Time: 02/12/21  7:18 AM  Result Value Ref Range   WBC 8.1 4.0 - 10.5 K/uL   RBC 4.38 3.87 - 5.11 MIL/uL   Hemoglobin 11.4 (L) 12.0 - 15.0 g/dL   HCT 35.7 (L) 36.0 - 46.0 %   MCV 81.5 80.0 - 100.0 fL   MCH 26.0 26.0 - 34.0 pg   MCHC 31.9 30.0 - 36.0 g/dL   RDW 14.3 11.5 - 15.5 %   Platelets 250 150 - 400  K/uL   nRBC 0.0 0.0 - 0.2 %  Comprehensive metabolic panel   Collection Time: 02/12/21  7:18 AM  Result Value Ref Range   Sodium 135 135 - 145 mmol/L   Potassium 4.0 3.5 - 5.1 mmol/L   Chloride 106 98 - 111 mmol/L   CO2 21 (L) 22 - 32 mmol/L   Glucose, Bld 110 (H) 70 - 99 mg/dL   BUN 8 6 - 20 mg/dL   Creatinine, Ser 0.63 0.44 -  1.00 mg/dL   Calcium 9.1 8.9 - 10.3 mg/dL   Total Protein 6.2 (L) 6.5 - 8.1 g/dL   Albumin 2.7 (L) 3.5 - 5.0 g/dL   AST 17 15 - 41 U/L   ALT 14 0 - 44 U/L   Alkaline Phosphatase 195 (H) 38 - 126 U/L   Total Bilirubin 0.7 0.3 - 1.2 mg/dL   GFR, Estimated >60 >60 mL/min   Anion gap 8 5 - 15  Type and screen Nettie   Collection Time: 02/12/21  7:25 AM  Result Value Ref Range   ABO/RH(D) AB POS    Antibody Screen NEG    Sample Expiration      02/15/2021,2359 Performed at [facility], [place] [address], [place], [place] [address]     Patient Active Problem List   Diagnosis Date Noted   Depression affecting pregnancy 01/01/2021   Supervision of high risk pregnancy, antepartum 11/01/2020   History of cesarean section 11/01/2020   History of fetal anomaly in prior pregnancy, currently pregnant 11/01/2020   Gestational diabetes mellitus (GDM), antepartum 10/19/2020   Asthma 02/21/2017   ADHD 02/21/2017    Assessment: Julieanne Hadsall is a 25 y.o. G2P0102 at 64w3dhere for scheduled CS.  #Repeat CS:  The risks of cesarean section discussed with the patient included but were not limited to: bleeding which may require transfusion or reoperation; infection which may require antibiotics; injury to bowel, bladder, ureters or other surrounding organs; injury to the fetus; need for additional procedures including hysterectomy in the event of a life-threatening hemorrhage; placental abnormalities with subsequent pregnancies, incisional problems, thromboembolic phenomenon and other postoperative/anesthesia complications. The patient  concurred with the proposed plan, giving informed written consent for the procedure. Patient has been NPO since last night she will remain NPO for procedure. Anesthesia and OR aware. Preoperative prophylactic antibiotics and SCDs ordered on call to the OR. To OR when ready.   #Anesthesia: Spinal #FWB: FHR 158 bpm #GBS/ID: neg #COVID: swab neg on 02/12/21 #MOF: Breast and bottle #MOC: Inpatient nexplanon #Circ: n/a  #GDMA2: on metformin for pregnancy, will get fasting CBG tomorrow  #Depression: cont zoloft   MClarnce Flock7/25/2022, 8:52 AM

## 2021-02-13 ENCOUNTER — Encounter (HOSPITAL_COMMUNITY): Payer: Self-pay | Admitting: Family Medicine

## 2021-02-13 DIAGNOSIS — Z30017 Encounter for initial prescription of implantable subdermal contraceptive: Secondary | ICD-10-CM

## 2021-02-13 LAB — CBC
HCT: 28.4 % — ABNORMAL LOW (ref 36.0–46.0)
Hemoglobin: 8.9 g/dL — ABNORMAL LOW (ref 12.0–15.0)
MCH: 25.7 pg — ABNORMAL LOW (ref 26.0–34.0)
MCHC: 31.3 g/dL (ref 30.0–36.0)
MCV: 82.1 fL (ref 80.0–100.0)
Platelets: 213 10*3/uL (ref 150–400)
RBC: 3.46 MIL/uL — ABNORMAL LOW (ref 3.87–5.11)
RDW: 14.6 % (ref 11.5–15.5)
WBC: 9.8 10*3/uL (ref 4.0–10.5)
nRBC: 0 % (ref 0.0–0.2)

## 2021-02-13 LAB — GLUCOSE, CAPILLARY: Glucose-Capillary: 169 mg/dL — ABNORMAL HIGH (ref 70–99)

## 2021-02-13 MED ORDER — ETONOGESTREL 68 MG ~~LOC~~ IMPL
68.0000 mg | DRUG_IMPLANT | Freq: Once | SUBCUTANEOUS | Status: AC
  Administered 2021-02-13: 68 mg via SUBCUTANEOUS
  Filled 2021-02-13: qty 1

## 2021-02-13 MED ORDER — LIDOCAINE HCL 1 % IJ SOLN
0.0000 mL | Freq: Once | INTRAMUSCULAR | Status: AC | PRN
  Administered 2021-02-13: 3 mL via INTRADERMAL
  Filled 2021-02-13: qty 20

## 2021-02-13 NOTE — Social Work (Signed)
CSW received consult for hx of Depression.  CSW met with MOB to offer support and complete assessment.     CSW introduced self and role. CSW observed FOB present and infant sleeping in bassinet. CSW was provided permission to speak with MOB alone. MOB was polite and welcoming to visit. CSW informed MOB of reason for consult. MOB was understanding and disclosed she has a history of postpartum depression and anxiety. MOB stated she was first diagnosed in 2018, following the birth of her twin boys. MOB shared one of her sons received a diagnosis that caused him to have an extended hospital stay. MOB stated the hospitalization took a toll on her emotions. CSW expressed understanding and validated MOB's feelings. CSW asked MOB about the current pregnancy. MOB reported she experienced a lot of anxiety and depression during this pregnancy. MOB disclosed the father of her twin boys died unexpectedly on their third birthday and she has been trying to cope with it. CSW encouraged MOB to seek support and grieve services. CSW inquired on therapy and medication. MOB reported she was prescribed Zoloft 61m and Hydroxyzine 232m, which she stopped taking earlier in the week in preparation for the C-Section. MOB stated it was helpful and she plans to resume postpartum. MOB shared she also attended therapy for a short amount of time and is interested in restarting. MOB was receptive to mental health resources and stated she feels comfortable seeking professional help if needs arise. CSW inquired on MOB support system. MOB identified a large support system consisting of FOB, her mother, grandmother, cousins, sister, as well as FOB family. MOB denies any current SI, HI or being involved in DV. CSW assessed MOB's current emotions. MOB reported she is feeling good today. MOB shared she was experiencing some anxiety yesterday, due to waiting so long for L&D.   CSW provided education regarding the baby blues period versus PPD. CSW  provided the New Mom Checklist and encouraged MOB to self evaluate and contact a medical professional if symptoms are noted at any time.   CSW provided review of Sudden Infant Death Syndrome (SIDS) precautions. MOB reported she has all infant essentials including a crib and car seat. MOB identified [place] follow-up care and denies any transportation barriers. MOB reported she has no additional needs at this time.   CSW identifies no further need for intervention and no barriers to discharge at this time.  ChDarra LisLCPacific Groveork WoEnterprise Productsnd [place] [phone]

## 2021-02-13 NOTE — Lactation Note (Signed)
This note was copied from a baby's chart. Lactation Consultation Note  Patient Name: Girl Vashti Bolanos [id number] Date: 02/13/2021 Reason for consult: Follow-up assessment;Term;Difficult latch;Infant weight loss Age:25 hours 2nd LC visit today, mom called as LC requested.  Baby awake and fussy/ LC checked and changed a wet diaper.  Mom has compressible areolas for latching, and colostrum flow.  1st attempt to latch was 2 intervals of strong sucks / few swallows and released.  Spoon fed 2 ml / tolerated well/ and changed position to lying moms breast on a pillow and the baby in cross cradle -  latched for 5 mins with swallows/ and sustained the latch better than the 1st attempt. Latch of 7 x 2  Baby has had 3 bottles ( with large volumes for baby's age 98 and has gotten use the quick flow ) .  LC plan :  Keep working on the latching in the cross- cradle as shown . With feeding cues 8-12 times a day. If the baby doesn't latch  feed the baby EBM or formula with a bottle ( since the baby has already had 3 ).  Post pump both breast for 15 mins / safe milk and hand express.   LC reassured mom latching is going to take time.   LC gave report to the [facility] and after mom eats lunch asked her to review the set up of the DEBP with mom.    Maternal Data Has patient been taught Hand Expression?: Yes Does the patient have breastfeeding experience prior to this delivery?: Yes How long did the patient breastfeed?: per mom with her Twins ( 1st babies Difficult to latch and pumped and bottle fed )  Feeding Mother's Current Feeding Choice: Breast Milk and Formula  LATCH Score Latch: Repeated attempts needed to sustain latch, nipple held in mouth throughout feeding, stimulation needed to elicit sucking reflex.  Audible Swallowing: A few with stimulation  Type of Nipple: Everted at rest and after stimulation  Comfort (Breast/Nipple): Soft / non-tender  Hold (Positioning): Assistance  needed to correctly position infant at breast and maintain latch.  LATCH Score: 7   Lactation Tools Discussed/Used Tools: Pump Breast pump type: Double-Electric Breast Pump;Manual  Interventions Interventions: Breast feeding basics reviewed;Assisted with latch;Hand express;Breast compression;Adjust position;Support pillows;Position options;Education  Discharge    Consult Status Consult Status: Follow-up Date: 02/13/21 Follow-up type: In-patient    Matilde Sprang Brittanny Levenhagen 02/13/2021, 11:26 AM

## 2021-02-13 NOTE — Procedures (Signed)

## 2021-02-13 NOTE — Progress Notes (Addendum)
Post Operative Day 1 Subjective: Beverly Roy endorses feeling well today with no pain. She has been able to ambulate independently. She has been voiding urine without problem. She has passed flatus but has not yet had a BM. She reports drinking grape juice throughout the morning while taking her medications so her blood glucose was not fasting. She denies any feelings of dizziness, weakness, or fatigue.   Objective: Blood pressure 128/76, pulse (!) 119, temperature 98.8 F (37.1 C), temperature source Oral, resp. rate 18, height 5\' 2"  (1.575 m), weight 115.2 kg, last menstrual period 04/21/2020, SpO2 99 %, unknown if currently breastfeeding.  Physical Exam:  General: alert and no distress Lochia: appropriate Uterine Fundus: firm Incision: dressing is intact and has blood on it but nursing reported blood was transfer from a peri pad the patient had been wearing  DVT Evaluation: No significant calf/ankle edema.  Recent Labs    02/12/21 0718 02/13/21 0438  HGB 11.4* 8.9*  HCT 35.7* 28.4*    Assessment/Plan: Plan for discharge tomorrow  Hyperglycemia/A2GDM  - Blood glucose was not a true fasting value as patient had been drinking grape juice  - Will ordered a repeat fasting glucose for tomorrow   Gestational HTN  - BP well controlled with Procardia 30 mg   Tachycardia - Nursing reported patient had been in pain this morning prior to receiving Vicodin - Patient asymptomatic, will see if HR improves or further testing is warranted   Anemia  - Hgb dropped from 11.4 to 8.9, patient asymptomatic  - Will order PO iron supplement   Feeding: breast and bottle  Contraception: Nexplanon to be placed today  Pain: well controlled    LOS: 1 day   02/15/21 02/13/2021, 9:26 AM   CNM attestation:  I have seen and examined this patient and agree with above documentation in the PA student'ss note.   Beverly Roy is a 25 y.o. G2P1103 at 1 day post rCS. SHe is ambulating, denies  any pain or bleeding.   PE: Patient Vitals for the past 24 hrs:  BP Temp Temp src Pulse Resp SpO2  02/13/21 1300 127/85 97.6 F (36.4 C) Oral 95 18 99 %  02/13/21 0654 -- -- -- (!) 119 -- --  02/13/21 0402 128/76 98.8 F (37.1 C) Oral (!) 115 18 99 %  02/13/21 0010 116/86 98 F (36.7 C) Oral (!) 102 18 100 %  02/12/21 2026 -- 97.8 F (36.6 C) Oral -- 18 99 %  02/12/21 1612 (!) 139/96 97.8 F (36.6 C) -- 79 18 99 %  02/12/21 1458 (!) 138/93 97.9 F (36.6 C) -- -- 18 --   Gen: calm comfortable, NAD Resp: normal effort, no distress Heart: Regular rate Abd: Soft, NT, gravid, S=D Bottom of dressing is saturated but RN states that is due to staining from patient's peripad. She is ambulating and appears well.   ROS, labs, PMH reviewed   MDM Patient doing well, she is asymptomatic.   Assessment: Healthy PP Day s/p RCS # 1 Plan: Nexplanon to be placed this afternoon Continue to take blood pressure medicine Try for fasting glucose tomorrow (patient had grape juice this morning)  02/14/21, CNM 02/13/2021 2:52 PM

## 2021-02-13 NOTE — Lactation Note (Signed)
This note was copied from a baby's chart. Lactation Consultation Note  Patient Name: Girl Bertha Lokken [id number] Date: 02/13/2021 Reason for consult: Initial assessment;Term;Infant weight loss;Other (Comment) (3 % weight loss/ per mom baby last fed at 7am - 35 ml of formula / asleep / not showing feeding cues/ LC encouraged hand expressing / and to call with feeding cues.)GDM  Age:25 hours - exp BF  As LC entered room baby asleep in the crib, mom awake, dad asleep.  See note above.  DEBP already set up.  LC recommended for now hand express while baby asleep and save the milk and at consult will work on latching.  LC reviewed the doc flow sheets/ WNL   LC provided the [facility] brochure with resources for after D/C.    Maternal Data Has patient been taught Hand Expression?: Yes (per mom the nurse showed her) Does the patient have breastfeeding experience prior to this delivery?: Yes How long did the patient breastfeed?: per mom a few months / milk did come in and several breast changes this pregnancy  Feeding Mother's Current Feeding Choice: Breast Milk and Formula  LATCH Score                    Lactation Tools Discussed/Used Tools: Pump (DEBP was already set up) Breast pump type: Double-Electric Breast Pump  Interventions Interventions: Breast feeding basics reviewed;Education  Discharge    Consult Status Consult Status: Follow-up Date: 02/13/21 Follow-up type: In-patient    Matilde Sprang Demont Linford 02/13/2021, 8:23 AM

## 2021-02-14 ENCOUNTER — Other Ambulatory Visit (HOSPITAL_COMMUNITY): Payer: Self-pay

## 2021-02-14 ENCOUNTER — Other Ambulatory Visit: Payer: Self-pay

## 2021-02-14 DIAGNOSIS — Z3A39 39 weeks gestation of pregnancy: Secondary | ICD-10-CM | POA: Diagnosis not present

## 2021-02-14 DIAGNOSIS — O34211 Maternal care for low transverse scar from previous cesarean delivery: Secondary | ICD-10-CM | POA: Diagnosis not present

## 2021-02-14 DIAGNOSIS — O24425 Gestational diabetes mellitus in childbirth, controlled by oral hypoglycemic drugs: Secondary | ICD-10-CM | POA: Diagnosis not present

## 2021-02-14 LAB — GLUCOSE, CAPILLARY: Glucose-Capillary: 91 mg/dL (ref 70–99)

## 2021-02-14 MED ORDER — NIFEDIPINE ER 30 MG PO TB24
30.0000 mg | ORAL_TABLET | Freq: Every day | ORAL | 0 refills | Status: DC
  Filled 2021-02-14: qty 30, 30d supply, fill #0

## 2021-02-14 MED ORDER — HYDROCODONE-ACETAMINOPHEN 5-325 MG PO TABS
1.0000 | ORAL_TABLET | ORAL | 0 refills | Status: DC | PRN
  Filled 2021-02-14: qty 20, 4d supply, fill #0

## 2021-02-14 MED ORDER — IBUPROFEN 600 MG PO TABS
600.0000 mg | ORAL_TABLET | Freq: Four times a day (QID) | ORAL | 0 refills | Status: DC
  Filled 2021-02-14: qty 30, 8d supply, fill #0

## 2021-02-14 MED ORDER — METFORMIN HCL 500 MG PO TABS
500.0000 mg | ORAL_TABLET | Freq: Two times a day (BID) | ORAL | 5 refills | Status: DC
  Filled 2021-02-14: qty 60, 30d supply, fill #0

## 2021-02-14 NOTE — Progress Notes (Addendum)
Post Operative Day 2 Subjective: Beverly Roy endorses feeling well today with no pain. She has been able to ambulate independently. She has been voiding urine without problem. She has passed flatus but has not yet had a BM. She denies any feelings of dizziness, weakness, or fatigue. Anxious to go home  Objective: Blood pressure 129/85, pulse 84, temperature 98.3 F (36.8 C), temperature source Oral, resp. rate 18, height 5\' 2"  (1.575 m), weight 115.2 kg, last menstrual period 04/21/2020, SpO2 100 %, unknown if currently breastfeeding.  Physical Exam:  General: alert and no distress Lochia: appropriate Uterine Fundus: firm Incision: dressing is intact with minimal strikethrough  DVT Evaluation: No significant calf/ankle edema.  Recent Labs    02/12/21 0718 02/13/21 0438  HGB 11.4* 8.9*  HCT 35.7* 28.4*    Assessment/Plan: Plan for discharge tomorrow Plan for 1 week incision and blood pressure check  Hyperglycemia/A2GDM  - fasting glucose 91 today, would potentially still benefit from metformin 500mg    Gestational HTN  - BP well controlled with Procardia 30 mg   Tachycardia -Resolved  Anemia  - Hgb dropped from 11.4 to 8.9, patient asymptomatic  - PO iron supplement   Feeding: breast and bottle  Contraception: Nexplanon placed 7/26  Pain: well controlled    LOS: 2 days   02/14/2021, 6:40 AM    Attestation:  I confirm that I have verified the information documented in the resident's note and that I have also personally reperformed the physical exam and all medical decision making activities.

## 2021-02-14 NOTE — Progress Notes (Signed)
Fasting blood glucose this AM - 91

## 2021-02-19 ENCOUNTER — Ambulatory Visit (INDEPENDENT_AMBULATORY_CARE_PROVIDER_SITE_OTHER): Payer: Medicaid Other

## 2021-02-19 ENCOUNTER — Other Ambulatory Visit: Payer: Self-pay

## 2021-02-19 DIAGNOSIS — F32A Depression, unspecified: Secondary | ICD-10-CM

## 2021-02-19 DIAGNOSIS — F909 Attention-deficit hyperactivity disorder, unspecified type: Secondary | ICD-10-CM

## 2021-02-19 DIAGNOSIS — O99340 Other mental disorders complicating pregnancy, unspecified trimester: Secondary | ICD-10-CM

## 2021-02-19 NOTE — Addendum Note (Signed)
Addended by: Isabell Jarvis on: 02/19/2021 10:49 AM   Modules accepted: Orders

## 2021-02-19 NOTE — Progress Notes (Signed)
Pt here today for wound check. Pt had repeat C-section on 02/12/21.  Pt denies any drainage, redness or fever to site.   Incision today is clean, dry, intact and well approximated. Pt had removed  dressing  4 days Post op at home. Pt advised to keep clean and dry. Pt verbalized understanding.   Pt has elevated BP at today's visit. Pt states was instructed to stop taking BP meds at discharge from hospital. Pt denies all HTN symptoms at this time. BP retaken, 130/85 and normalized.   Pt has scheduled PP visit on 03/12/21.   Judeth Cornfield, RN

## 2021-02-19 NOTE — Progress Notes (Signed)
Patient seen and assessed by nursing staff.  Agree with documentation and plan.  

## 2021-02-20 ENCOUNTER — Ambulatory Visit: Payer: Medicaid Other | Admitting: Clinical

## 2021-02-20 DIAGNOSIS — Z91199 Patient's noncompliance with other medical treatment and regimen due to unspecified reason: Secondary | ICD-10-CM

## 2021-02-20 DIAGNOSIS — Z5329 Procedure and treatment not carried out because of patient's decision for other reasons: Secondary | ICD-10-CM

## 2021-02-20 NOTE — BH Specialist Note (Signed)
Pt did not arrive to video visit and did not answer the phone ; Left HIPPA-compliant message to call back Asher Muir from Center for [employer] at [facility] at  [phone] [facility] office).  ; Unable to leave MyChart message, as MyChart has not been set up yet.

## 2021-02-21 ENCOUNTER — Other Ambulatory Visit: Payer: Self-pay

## 2021-02-22 ENCOUNTER — Telehealth (HOSPITAL_COMMUNITY): Payer: Self-pay

## 2021-02-22 NOTE — Telephone Encounter (Signed)
No answer. Left message to return nurse call.  Marcelino Duster [facility] 02/22/2021,1907

## 2021-03-12 ENCOUNTER — Ambulatory Visit: Payer: Medicaid Other | Admitting: Nurse Practitioner

## 2021-03-28 ENCOUNTER — Ambulatory Visit: Payer: Medicaid Other | Admitting: Nurse Practitioner

## 2021-04-11 ENCOUNTER — Other Ambulatory Visit: Payer: Self-pay

## 2021-04-11 ENCOUNTER — Ambulatory Visit (INDEPENDENT_AMBULATORY_CARE_PROVIDER_SITE_OTHER): Payer: Medicaid Other | Admitting: Family Medicine

## 2021-04-11 DIAGNOSIS — O24415 Gestational diabetes mellitus in pregnancy, controlled by oral hypoglycemic drugs: Secondary | ICD-10-CM

## 2021-04-11 DIAGNOSIS — O139 Gestational [pregnancy-induced] hypertension without significant proteinuria, unspecified trimester: Secondary | ICD-10-CM

## 2021-04-11 DIAGNOSIS — F331 Major depressive disorder, recurrent, moderate: Secondary | ICD-10-CM

## 2021-04-11 MED ORDER — BUSPIRONE HCL 5 MG PO TABS: 5.0000 mg | ORAL_TABLET | Freq: Two times a day (BID) | ORAL | 3 refills | Status: DC

## 2021-04-11 NOTE — Progress Notes (Signed)
Post Partum Visit Note  Beverly Roy is a 25 y.o. [phone] female who presents for a postpartum visit. She is 8 weeks postpartum following a repeat cesarean section.  I have fully reviewed the prenatal and intrapartum course. The delivery was at [redacted]w[redacted]d gestational weeks.  Anesthesia: spinal. Postpartum course has been normal. Baby is doing well Baby is feeding by bottle Rush Barer . Bleeding no bleeding. Bowel function is normal. Bladder function is normal. Patient is sexually active. Contraception method is Nexplanon. Postpartum depression screening: negative.   The pregnancy intention screening data noted above was reviewed. Potential methods of contraception were discussed. The patient elected to proceed with No data recorded.   Edinburgh Postnatal Depression Scale - 04/11/21 1325       Edinburgh Postnatal Depression Scale:  In the Past 7 Days   I have been able to laugh and see the funny side of things. 1    I have looked forward with enjoyment to things. 0    I have blamed myself unnecessarily when things went wrong. 0    I have been anxious or worried for no good reason. 3    I have felt scared or panicky for no good reason. 2    Things have been getting on top of me. 2    I have been so unhappy that I have had difficulty sleeping. 0    I have felt sad or miserable. 3    I have been so unhappy that I have been crying. 2    The thought of harming myself has occurred to me. 0    Edinburgh Postnatal Depression Scale Total 13             Health Maintenance Due  Topic Date Due   COVID-19 Vaccine (1) Never done   URINE MICROALBUMIN  Never done   HPV VACCINES (1 - 2-dose series) Never done   INFLUENZA VACCINE  02/19/2021    The following portions of the patient's history were reviewed and updated as appropriate: allergies, current medications, past family history, past medical history, past social history, past surgical history, and problem list.  Review of  Systems Pertinent items are noted in HPI.  Objective:  BP 127/75   Pulse 97   Wt 248 lb (112.5 kg)   LMP 04/21/2020   Breastfeeding No   BMI 45.36 kg/m    General:  alert, cooperative, and no distress   Breasts:  not indicated  Lungs: clear to auscultation bilaterally  Heart:  regular rate and rhythm, S1, S2 normal, no murmur, click, rub or gallop  Abdomen: soft, non-tender; bowel sounds normal; no masses,  no organomegaly   Wound well approximated incision  GU exam:  not indicated       Assessment:     1. Postpartum exam   2. Cesarean delivery delivered   3. Gestational diabetes mellitus (GDM) controlled on oral hypoglycemic drug, antepartum   4. Gestational hypertension, antepartum   5. Moderate episode of recurrent major depressive disorder (HCC)      Plan:   Essential components of care per ACOG recommendations:  1.  Mood and well being: Patient with positive depression screening today. Reviewed local resources for support. Is on  - Patient tobacco use? No.   - hx of drug use? No.    2. Infant care and feeding:  -Patient currently breastmilk feeding? No.  -Social determinants of health (SDOH) reviewed in EPIC. No concerns  3. Sexuality, contraception and birth spacing -  Patient does not want a pregnancy in the next year.   - Reviewed forms of contraception in tiered fashion. Patient desired  nexplanon  today.   - Discussed birth spacing of 18 months  4. Sleep and fatigue -Encouraged family/partner/community support of 4 hrs of uninterrupted sleep to help with mood and fatigue  5. Physical Recovery  - Discussed patients delivery and complications. She describes her delivery as good. - Patient had a C-section.  Perineal healing reviewed. Patient expressed understanding - Patient has urinary incontinence? No. - Patient is safe to resume physical and sexual activity  6.  Health Maintenance - HM due items addressed Yes - Last pap smear No results found for:  DIAGPAP Pap smear done 5 months ago -Breast Cancer screening indicated? No.   7. Chronic Disease/Pregnancy Condition follow up: None  Stop antihypertensives (patient not taking) Schedule 2hr PP GTT  Add on buspar instead of vistaril for anxiety. - PCP follow up  Levie Heritage, DO Center for [employer], [facility]

## 2021-04-16 NOTE — BH Specialist Note (Signed)
Pt did not arrive to video visit and did not answer the phone ; Left HIPPA-compliant message to call back Asher Muir from [employer] for [employer] at [facility] at  [phone] [facility] office).  ; unable to leave MyChart message, as it's not set up.

## 2021-04-17 ENCOUNTER — Ambulatory Visit: Payer: Medicaid Other | Admitting: Clinical

## 2021-04-17 DIAGNOSIS — Z91199 Patient's noncompliance with other medical treatment and regimen due to unspecified reason: Secondary | ICD-10-CM

## 2021-04-17 DIAGNOSIS — Z5329 Procedure and treatment not carried out because of patient's decision for other reasons: Secondary | ICD-10-CM

## 2021-04-25 ENCOUNTER — Other Ambulatory Visit: Payer: Self-pay

## 2021-04-25 DIAGNOSIS — Z8632 Personal history of gestational diabetes: Secondary | ICD-10-CM

## 2021-04-26 ENCOUNTER — Other Ambulatory Visit: Payer: Medicaid Other

## 2021-04-30 ENCOUNTER — Other Ambulatory Visit: Payer: Medicaid Other

## 2021-04-30 ENCOUNTER — Other Ambulatory Visit: Payer: Self-pay

## 2021-04-30 DIAGNOSIS — Z8632 Personal history of gestational diabetes: Secondary | ICD-10-CM

## 2021-05-01 LAB — GLUCOSE TOLERANCE, 2 HOURS
Glucose, 2 hour: 451 mg/dL — ABNORMAL HIGH (ref 70–139)
Glucose, GTT - Fasting: 279 mg/dL — ABNORMAL HIGH (ref 70–99)

## 2021-05-03 ENCOUNTER — Encounter: Payer: Self-pay | Admitting: Family Medicine

## 2021-05-03 ENCOUNTER — Telehealth: Payer: Self-pay

## 2021-05-03 NOTE — Telephone Encounter (Signed)
Call placed to pt. Spoke with pt. Pt given results and recommendations per Dr Crissie Reese. Pt verbalized understanding and agreeable to plan of care.  Pt will set up Mychart and ask if needs website to primary care. Stressed the importance of getting established with PCP. Pt verbalized understanding.  Judeth Cornfield, RN

## 2021-05-03 NOTE — Telephone Encounter (Signed)
-----   Message from Venora Maples, MD sent at 05/03/2021  1:19 PM EDT ----- Abnormal GTT No MyChart, clinical pool please contact patient to inform and stress importance of following up with a PCP as her numbers are very abnormal

## 2021-06-07 ENCOUNTER — Ambulatory Visit (HOSPITAL_COMMUNITY)
Admission: EM | Admit: 2021-06-07 | Discharge: 2021-06-07 | Disposition: A | Discharge status: Home / Self Care | Payer: Medicaid Other | Attending: Family Medicine | Admitting: Family Medicine

## 2021-06-07 ENCOUNTER — Encounter (HOSPITAL_COMMUNITY): Payer: Self-pay

## 2021-06-07 ENCOUNTER — Other Ambulatory Visit: Payer: Self-pay

## 2021-06-07 DIAGNOSIS — L03032 Cellulitis of left toe: Secondary | ICD-10-CM | POA: Diagnosis not present

## 2021-06-07 MED ORDER — DOXYCYCLINE HYCLATE 100 MG PO CAPS: 100.0000 mg | ORAL_CAPSULE | Freq: Two times a day (BID) | ORAL | 0 refills | Status: DC

## 2021-06-07 NOTE — ED Triage Notes (Signed)
Pt c/o lt great toe swelling, redness, and pain x2 days. States possible bug bite.

## 2021-06-07 NOTE — ED Provider Notes (Signed)
[facility]    CSN: [id number] Arrival date & time: 06/07/21  0815      History   Chief Complaint Chief Complaint  Patient presents with   Toe Pain    HPI Beverly Roy is a 25 y.o. female.   HPI Left toe swelling and draining pus over the last 3 days. Pain with walking as the side her toe is rubbing against her second toe.  Patient reports recently getting a hangnail while cutting her toenails and subsequently developed a pain and swelling involving the great toe.  She denies any fever, nausea or vomiting.  Past Medical History:  Diagnosis Date   Asthma    Gestational diabetes    Hypertension     Patient Active Problem List   Diagnosis Date Noted   Cesarean delivery delivered 02/12/2021   History of cesarean section 11/01/2020   History of fetal anomaly in prior pregnancy, currently pregnant 11/01/2020   DM2 (diabetes mellitus, type 2) (HCC) 10/19/2020   Asthma 02/21/2017   ADHD 02/21/2017    Past Surgical History:  Procedure Laterality Date   CESAREAN SECTION     CESAREAN SECTION WITH BILATERAL TUBAL LIGATION N/A 02/12/2021   Procedure: CESAREAN SECTION WITH BILATERAL TUBAL LIGATION;  Surgeon: Venora Maples, MD;  Location: [facility] OR;  Service: Obstetrics;  Laterality: N/A;    OB History     Gravida  2   Para  2   Term  1   Preterm  1   AB      Living  3      SAB      IAB      Ectopic      Multiple  1   Live Births  3            Home Medications    Prior to Admission medications   Medication Sig Start Date End Date Taking? Authorizing Provider  ibuprofen (ADVIL) 600 MG tablet Take 1 tablet (600 mg total) by mouth every 6 (six) hours. 02/14/21   Aviva Signs, CNM  metFORMIN (GLUCOPHAGE) 500 MG tablet Take 1 tablet (500 mg total) by mouth 2 (two) times daily with a meal. 02/14/21 02/14/21  Aviva Signs, CNM    Family History Family History  Problem Relation Age of Onset   Hypertension Father    Diabetes  Maternal Aunt    Cancer Maternal Aunt    Diabetes Maternal Grandmother     Social History Social History   Tobacco Use   Smoking status: Former    Types: Cigarettes    Quit date: 10/21/2016    Years since quitting: 4.6   Smokeless tobacco: Never  Vaping Use   Vaping Use: Never used  Substance Use Topics   Alcohol use: No   Drug use: Yes    Types: Marijuana    Comment: quit with +UPT     Allergies   Patient has no known allergies.   Review of Systems Review of Systems Pertinent negatives listed in HPI   Physical Exam Triage Vital Signs ED Triage Vitals  Enc Vitals Group     BP 06/07/21 0842 133/90     Pulse Rate 06/07/21 0842 (!) 104     Resp --      Temp 06/07/21 0842 98.6 F (37 C)     Temp Source 06/07/21 0842 Oral     SpO2 06/07/21 0842 98 %     Weight --      Height --  Head Circumference --      Peak Flow --      Pain Score 06/07/21 0849 5     Pain Loc --      Pain Edu? --      Excl. in [place]? --    No data found.  Updated Vital Signs BP 133/90 (BP Location: Left Arm)   Pulse (!) 104   Temp 98.6 F (37 C) (Oral)   LMP 06/04/2021   SpO2 98%   Breastfeeding No   Visual Acuity Right Eye Distance:   Left Eye Distance:   Bilateral Distance:    Right Eye Near:   Left Eye Near:    Bilateral Near:     Physical Exam Constitutional:      Appearance: She is obese.  HENT:     Head: Normocephalic.     Nose: Nose normal.     Mouth/Throat:     Mouth: Mucous membranes are moist.  Eyes:     Extraocular Movements: Extraocular movements intact.     Pupils: Pupils are equal, round, and reactive to light.  Cardiovascular:     Rate and Rhythm: Normal rate and regular rhythm.  Pulmonary:     Effort: Pulmonary effort is normal.     Breath sounds: Normal breath sounds.  Musculoskeletal:       Feet:  Skin:    General: Skin is warm.     Capillary Refill: Capillary refill takes less than 2 seconds.  Neurological:     General: No focal deficit  present.     Mental Status: She is alert and oriented to person, place, and time.  Psychiatric:        Attention and Perception: Attention normal.        Mood and Affect: Mood normal.     UC Treatments / Results  Labs (all labs ordered are listed, but only abnormal results are displayed) Labs Reviewed - No data to display  EKG   Radiology No results found.  Procedures Procedures (including critical care time)  Medications Ordered in UC Medications - No data to display  Initial Impression / Assessment and Plan / UC Course  I have reviewed the triage vital signs and the nursing notes.  Pertinent labs & imaging results that were available during my care of the patient were reviewed by me and considered in my medical decision making (see chart for details).  Paronychia of the great toe involving the left foot Paronychia is hard and in patient is a type II diabetic therefore feel I&D would be unsuccessful we will treat with doxycycline 100 mg twice daily for 10 days.  Patient to return if infection does not resolve.  Patient verbalized understanding and agreement with plan. Final diagnoses:  Paronychia of great toe of left foot   Discharge Instructions   None    ED Prescriptions     Medication Sig Dispense Auth. Provider   doxycycline (VIBRAMYCIN) 100 MG capsule Take 1 capsule (100 mg total) by mouth 2 (two) times daily. 20 capsule Scot Jun, FNP      PDMP not reviewed this encounter.   Scot Jun, Gervais 06/12/21 [phone]

## 2023-05-10 ENCOUNTER — Encounter (HOSPITAL_COMMUNITY): Payer: Self-pay | Admitting: Emergency Medicine

## 2023-05-10 ENCOUNTER — Ambulatory Visit (HOSPITAL_COMMUNITY)
Admission: EM | Admit: 2023-05-10 | Discharge: 2023-05-10 | Disposition: A | Discharge status: Home / Self Care | Payer: Medicaid Other | Attending: Internal Medicine | Admitting: Internal Medicine

## 2023-05-10 DIAGNOSIS — N611 Abscess of the breast and nipple: Secondary | ICD-10-CM | POA: Diagnosis not present

## 2023-05-10 MED ORDER — SILVER SULFADIAZINE 1 % EX CREA: 1.0000 | TOPICAL_CREAM | Freq: Two times a day (BID) | CUTANEOUS | 0 refills | Status: DC

## 2023-05-10 NOTE — Discharge Instructions (Signed)
Abscess has already drained, wash the area twice daily, apply the Silvadene and a dressing.  Follow-up for worsening symptoms or concerns

## 2023-05-10 NOTE — ED Triage Notes (Signed)
Pt c/o boil under left breast that she noticed Tuesday. Area is red with white fluid and painful.

## 2023-05-10 NOTE — ED Provider Notes (Signed)
[facility]    CSN: [id number] Arrival date & time: 05/10/23  1403      History   Chief Complaint Chief Complaint  Patient presents with   Breast Problem    HPI Beverly Roy is a 27 y.o. female.   HPI Boil on left breast for several days which spontaneously drained yesterday, she is concerned at the appearance of the area today. Admits pain.  Denies fever, chills, sweats.  Admits history of prior boils.  Past Medical History:  Diagnosis Date   Asthma    Gestational diabetes    Hypertension     Patient Active Problem List   Diagnosis Date Noted   Cesarean delivery delivered 02/12/2021   History of cesarean section 11/01/2020   History of fetal anomaly in prior pregnancy, currently pregnant 11/01/2020   DM2 (diabetes mellitus, type 2) (HCC) 10/19/2020   Asthma 02/21/2017   ADHD 02/21/2017    Past Surgical History:  Procedure Laterality Date   CESAREAN SECTION     CESAREAN SECTION WITH BILATERAL TUBAL LIGATION N/A 02/12/2021   Procedure: CESAREAN SECTION WITH BILATERAL TUBAL LIGATION;  Surgeon: Venora Maples, MD;  Location: [facility] OR;  Service: Obstetrics;  Laterality: N/A;    OB History     Gravida  2   Para  2   Term  1   Preterm  1   AB      Living  3      SAB      IAB      Ectopic      Multiple  1   Live Births  3            Home Medications    Prior to Admission medications   Medication Sig Start Date End Date Taking? Authorizing Provider  silver sulfADIAZINE (SILVADENE) 1 % cream Apply 1 Application topically 2 (two) times daily. 05/10/23  Yes Meliton Rattan, PA  doxycycline (VIBRAMYCIN) 100 MG capsule Take 1 capsule (100 mg total) by mouth 2 (two) times daily. Patient not taking: Reported on 05/10/2023 06/07/21   Bing Neighbors, NP  ibuprofen (ADVIL) 600 MG tablet Take 1 tablet (600 mg total) by mouth every 6 (six) hours. Patient not taking: Reported on 05/10/2023 02/14/21   Aviva Signs, CNM  metFORMIN  (GLUCOPHAGE) 500 MG tablet Take 1 tablet (500 mg total) by mouth 2 (two) times daily with a meal. 02/14/21 02/14/21  Aviva Signs, CNM    Family History Family History  Problem Relation Age of Onset   Hypertension Father    Diabetes Maternal Aunt    Cancer Maternal Aunt    Diabetes Maternal Grandmother     Social History Social History   Tobacco Use   Smoking status: Former    Current packs/day: 0.00    Types: Cigarettes    Quit date: 10/21/2016    Years since quitting: 6.5   Smokeless tobacco: Never  Vaping Use   Vaping status: Never Used  Substance Use Topics   Alcohol use: No   Drug use: Yes    Types: Marijuana    Comment: quit with +UPT     Allergies   Patient has no known allergies.   Review of Systems Review of Systems  Constitutional:  Negative for chills and fever.  Skin:  Positive for wound. Negative for color change.     Physical Exam Triage Vital Signs ED Triage Vitals  Encounter Vitals Group     BP 05/10/23 1505 112/75  Systolic BP Percentile --      Diastolic BP Percentile --      Pulse Rate 05/10/23 1505 (!) 103     Resp 05/10/23 1505 16     Temp 05/10/23 1505 (!) 97 F (36.1 C)     Temp Source 05/10/23 1505 Oral     SpO2 05/10/23 1505 98 %     Weight --      Height --      Head Circumference --      Peak Flow --      Pain Score 05/10/23 1503 9     Pain Loc --      Pain Education --      Exclude from Growth Chart --    No data found.  Updated Vital Signs BP 112/75 (BP Location: Left Arm)   Pulse (!) 103   Temp (!) 97 F (36.1 C) (Oral)   Resp 16   LMP 05/08/2023   SpO2 98%   Visual Acuity Right Eye Distance:   Left Eye Distance:   Bilateral Distance:    Right Eye Near:   Left Eye Near:    Bilateral Near:     Physical Exam Vitals reviewed.  Constitutional:      Appearance: She is not ill-appearing.  Cardiovascular:     Rate and Rhythm: Normal rate.  Pulmonary:     Effort: Pulmonary effort is normal.  Skin:     Comments: 1.5 cm open ulcerative wound left breast scant drainage, no surrounding erythema, no induration  Neurological:     Mental Status: She is alert and oriented to person, place, and time.      UC Treatments / Results  Labs (all labs ordered are listed, but only abnormal results are displayed) Labs Reviewed - No data to display  EKG   Radiology No results found.  Procedures Procedures (including critical care time)  Medications Ordered in UC Medications - No data to display  Initial Impression / Assessment and Plan / UC Course  I have reviewed the triage vital signs and the nursing notes.  Pertinent labs & imaging results that were available during my care of the patient were reviewed by me and considered in my medical decision making (see chart for details).     27 year old female with abscess to left breast that spontaneously drained at home.  Wound was cleaned, dressing applied.  Will Rx Silvadene, home care and follow-up reviewed with patient, warning signs reviewed follow-up for fever, significant worsening of symptoms local redness swelling or concerns Final Clinical Impressions(s) / UC Diagnoses   Final diagnoses:  Abscess of left breast     Discharge Instructions      Abscess has already drained, wash the area twice daily, apply the Silvadene and a dressing.  Follow-up for worsening symptoms or concerns   ED Prescriptions     Medication Sig Dispense Auth. Provider   silver sulfADIAZINE (SILVADENE) 1 % cream Apply 1 Application topically 2 (two) times daily. 50 g Meliton Rattan, [place]      PDMP not reviewed this encounter.   Meliton Rattan, [place] 05/10/23 1528

## 2023-06-27 ENCOUNTER — Encounter (HOSPITAL_COMMUNITY): Payer: Self-pay | Admitting: *Deleted

## 2023-06-27 ENCOUNTER — Ambulatory Visit (HOSPITAL_COMMUNITY)
Admission: EM | Admit: 2023-06-27 | Discharge: 2023-06-27 | Disposition: A | Discharge status: Home / Self Care | Payer: Medicaid Other | Attending: Family Medicine | Admitting: Family Medicine

## 2023-06-27 DIAGNOSIS — R200 Anesthesia of skin: Secondary | ICD-10-CM | POA: Diagnosis not present

## 2023-06-27 DIAGNOSIS — E0865 Diabetes mellitus due to underlying condition with hyperglycemia: Secondary | ICD-10-CM

## 2023-06-27 LAB — POCT FASTING CBG KUC MANUAL ENTRY: POCT Glucose (KUC): 273 mg/dL — AB (ref 70–99)

## 2023-06-27 MED ORDER — METFORMIN HCL 500 MG PO TABS: 500.0000 mg | ORAL_TABLET | Freq: Two times a day (BID) | ORAL | 2 refills | Status: DC

## 2023-06-27 MED ORDER — FLUCONAZOLE 150 MG PO TABS: 150.0000 mg | ORAL_TABLET | ORAL | 0 refills | Status: AC

## 2023-06-27 NOTE — ED Triage Notes (Signed)
Pt states she is having right arm numbness a few hours, it started today.   She states she is diabetic and hasn't had any meds for months, she also doesn't have a meter to check her blood sugar. She was dx with diabetes when she was pregnant and hasn't seen anyone since.    She complains of vaginal itching X 1 week.

## 2023-06-27 NOTE — ED Provider Notes (Addendum)
[facility]    CSN: [id number] Arrival date & time: 06/27/23  1414      History   Chief Complaint Chief Complaint  Patient presents with   Numbness    HPI Beverly Roy is a 27 y.o. female.   HPI Here for right arm numbness.  This morning she had numbness from her right elbow down to her wrist onto her hand.  It is actually a little less intense now.  There is not much tingling.  She does feel like maybe her right grip is less strong.  No headache. No fever She does have a history of diabetes and was treated with metformin previously apparently she has been out of her metformin for 4 or 5 months as she had not seen the prescribing provider in a while.  Does sound like that prescribing provider might have been an OB/GYN.  She does not have primary care  She states she has lost her meter.  Prior to her moved to another house she was checking her sugar occasionally and would get somewhere between upper 100s to 400.  She has had some white vaginal discharge and itching lately. Past Medical History:  Diagnosis Date   Asthma    Gestational diabetes    Hypertension     Patient Active Problem List   Diagnosis Date Noted   Cesarean delivery delivered 02/12/2021   History of cesarean section 11/01/2020   History of fetal anomaly in prior pregnancy, currently pregnant 11/01/2020   DM2 (diabetes mellitus, type 2) (HCC) 10/19/2020   Asthma 02/21/2017   ADHD 02/21/2017    Past Surgical History:  Procedure Laterality Date   CESAREAN SECTION     CESAREAN SECTION WITH BILATERAL TUBAL LIGATION N/A 02/12/2021   Procedure: CESAREAN SECTION WITH BILATERAL TUBAL LIGATION;  Surgeon: Venora Maples, MD;  Location: [facility] OR;  Service: Obstetrics;  Laterality: N/A;    OB History     Gravida  2   Para  2   Term  1   Preterm  1   AB      Living  3      SAB      IAB      Ectopic      Multiple  1   Live Births  3            Home Medications     Prior to Admission medications   Medication Sig Start Date End Date Taking? Authorizing Provider  fluconazole (DIFLUCAN) 150 MG tablet Take 1 tablet (150 mg total) by mouth every 3 (three) days for 2 doses. 06/27/23 07/01/23 Yes Zenia Resides, MD  metFORMIN (GLUCOPHAGE) 500 MG tablet Take 1 tablet (500 mg total) by mouth 2 (two) times daily with a meal. 06/27/23  Yes Zenia Resides, MD  etonogestrel (NEXPLANON) 68 MG IMPL implant 1 each by Subdermal route once.    [provider]    Family History Family History  Problem Relation Age of Onset   Hypertension Father    Diabetes Maternal Aunt    Cancer Maternal Aunt    Diabetes Maternal Grandmother     Social History Social History   Tobacco Use   Smoking status: Former    Current packs/day: 0.00    Types: Cigarettes    Quit date: 10/21/2016    Years since quitting: 6.6   Smokeless tobacco: Never  Vaping Use   Vaping status: Never Used  Substance Use Topics   Alcohol use: No  Drug use: Yes    Types: Marijuana    Comment: quit with +UPT     Allergies   Patient has no known allergies.   Review of Systems Review of Systems   Physical Exam Triage Vital Signs ED Triage Vitals  Encounter Vitals Group     BP 06/27/23 1456 106/73     Systolic BP Percentile --      Diastolic BP Percentile --      Pulse Rate 06/27/23 1456 (!) 103     Resp 06/27/23 1456 18     Temp 06/27/23 1456 98.8 F (37.1 C)     Temp Source 06/27/23 1456 Oral     SpO2 06/27/23 1456 97 %     Weight --      Height --      Head Circumference --      Peak Flow --      Pain Score 06/27/23 1455 0     Pain Loc --      Pain Education --      Exclude from Growth Chart --    No data found.  Updated Vital Signs BP 106/73 (BP Location: Right Arm)   Pulse (!) 103   Temp 98.8 F (37.1 C) (Oral)   Resp 18   LMP 06/17/2023 (Approximate)   SpO2 97%   Visual Acuity Right Eye Distance:   Left Eye Distance:   Bilateral Distance:     Right Eye Near:   Left Eye Near:    Bilateral Near:     Physical Exam Vitals reviewed.  Constitutional:      General: She is not in acute distress.    Appearance: She is not ill-appearing, toxic-appearing or diaphoretic.  HENT:     Nose: Nose normal.     Mouth/Throat:     Mouth: Mucous membranes are moist.  Eyes:     Extraocular Movements: Extraocular movements intact.     Conjunctiva/sclera: Conjunctivae normal.     Pupils: Pupils are equal, round, and reactive to light.  Cardiovascular:     Rate and Rhythm: Normal rate and regular rhythm.     Heart sounds: No murmur heard. Pulmonary:     Effort: Pulmonary effort is normal.     Breath sounds: Normal breath sounds.  Musculoskeletal:     Cervical back: Neck supple.  Lymphadenopathy:     Cervical: No cervical adenopathy.  Skin:    Coloration: Skin is not pale.  Neurological:     General: No focal deficit present.     Mental Status: She is alert and oriented to person, place, and time.     Cranial Nerves: No cranial nerve deficit.     Coordination: Coordination normal.     Deep Tendon Reflexes: Reflexes normal.     Comments: There is decreased light touch sensation over the right lateral forearm.  Also her right grip is a little weak but still present.  There is no facial droop and no other abnormality on neurologic exam.  Psychiatric:        Behavior: Behavior normal.      UC Treatments / Results  Labs (all labs ordered are listed, but only abnormal results are displayed) Labs Reviewed  POCT FASTING CBG [facility] MANUAL ENTRY - Abnormal; Notable for the following components:      Result Value   POCT Glucose ([facility]) 273 (*)    All other components within normal limits    EKG   Radiology No results found.  Procedures Procedures (  including critical care time)  Medications Ordered in UC Medications - No data to display  Initial Impression / Assessment and Plan / UC Course  I have reviewed the triage vital signs  and the nursing notes.  Pertinent labs & imaging results that were available during my care of the patient were reviewed by me and considered in my medical decision making (see chart for details).     I discussed with her that her Medicaid [place] [employer] will not honor my printed prescription for monitoring supplies.  Sugar is 273. Metformin is restarted.  She is given instructions on how to set up primary care appointment  I do not feel that this numbness is indicative of a cerebrovascular accident.  She can have it worked up further with primary care, but if she worsens anyway she is to present to the emergency room Fluconazole is sent in for potential yeast vaginitis. Vaginal self swab is done, and we will notify of any positives on that and treat per protocol.  Final Clinical Impressions(s) / UC Diagnoses   Final diagnoses:  Numbness  Diabetes mellitus due to underlying condition with hyperglycemia, without long-term current use of insulin [facility])     Discharge Instructions      Your sugar is 273.  Normal is less than 120.  Staff will notify you if there is anything positive on the swab.  You can use the QR code/website at the back of the summary paperwork to schedule yourself a new patient appointment with primary care  If your arm numbness worsens or you have new symptoms like being unable to talk or use your muscles, then please go to the emergency room for further evaluation       ED Prescriptions     Medication Sig Dispense Auth. Provider   fluconazole (DIFLUCAN) 150 MG tablet Take 1 tablet (150 mg total) by mouth every 3 (three) days for 2 doses. 2 tablet Zenia Resides, MD   metFORMIN (GLUCOPHAGE) 500 MG tablet Take 1 tablet (500 mg total) by mouth 2 (two) times daily with a meal. 60 tablet Karas Pickerill, Janace Aris, MD      PDMP not reviewed this encounter.   Zenia Resides, MD 06/27/23 5621    Zenia Resides, MD 06/27/23 [phone]

## 2023-06-27 NOTE — Discharge Instructions (Addendum)
Your sugar is 273.  Normal is less than 120.  Staff will notify you if there is anything positive on the swab.  You can use the QR code/website at the back of the summary paperwork to schedule yourself a new patient appointment with primary care  If your arm numbness worsens or you have new symptoms like being unable to talk or use your muscles, then please go to the emergency room for further evaluation

## 2023-06-30 ENCOUNTER — Telehealth (HOSPITAL_COMMUNITY): Payer: Self-pay

## 2023-06-30 LAB — CERVICOVAGINAL ANCILLARY ONLY
Bacterial Vaginitis (gardnerella): POSITIVE — AB
Candida Glabrata: POSITIVE — AB
Candida Vaginitis: POSITIVE — AB
Chlamydia: NEGATIVE
Comment: NEGATIVE
Comment: NEGATIVE
Comment: NEGATIVE
Comment: NEGATIVE
Comment: NEGATIVE
Comment: NORMAL
Neisseria Gonorrhea: NEGATIVE
Trichomonas: NEGATIVE

## 2023-06-30 MED ORDER — METRONIDAZOLE 500 MG PO TABS: 500.0000 mg | ORAL_TABLET | Freq: Two times a day (BID) | ORAL | 0 refills | Status: AC

## 2023-06-30 NOTE — Telephone Encounter (Signed)
Per protocol, pt requires tx with metronidazole. Reviewed with patient, verified pharmacy, prescription sent.

## 2023-08-07 ENCOUNTER — Encounter (HOSPITAL_COMMUNITY): Payer: Self-pay

## 2023-08-07 ENCOUNTER — Ambulatory Visit (HOSPITAL_COMMUNITY)
Admission: EM | Admit: 2023-08-07 | Discharge: 2023-08-07 | Disposition: A | Discharge status: Home / Self Care | Payer: Medicaid Other | Attending: Internal Medicine | Admitting: Internal Medicine

## 2023-08-07 DIAGNOSIS — N898 Other specified noninflammatory disorders of vagina: Secondary | ICD-10-CM | POA: Diagnosis not present

## 2023-08-07 DIAGNOSIS — E1365 Other specified diabetes mellitus with hyperglycemia: Secondary | ICD-10-CM | POA: Insufficient documentation

## 2023-08-07 DIAGNOSIS — B356 Tinea cruris: Secondary | ICD-10-CM | POA: Insufficient documentation

## 2023-08-07 LAB — POCT FASTING CBG KUC MANUAL ENTRY: POCT Glucose (KUC): 267 mg/dL — AB (ref 70–99)

## 2023-08-07 MED ORDER — KETOCONAZOLE 2 % EX CREA: 1.0000 | TOPICAL_CREAM | Freq: Every day | CUTANEOUS | 0 refills | Status: AC

## 2023-08-07 MED ORDER — BLOOD GLUCOSE TEST STRIPS 333 VI STRP: ORAL_STRIP | 0 refills | Status: AC

## 2023-08-07 MED ORDER — NYSTATIN 100000 UNIT/GM EX POWD
1.0000 | Freq: Three times a day (TID) | CUTANEOUS | 0 refills | Status: AC
Start: 2023-08-07 — End: ?

## 2023-08-07 MED ORDER — METFORMIN HCL 500 MG PO TABS: 1000.0000 mg | ORAL_TABLET | Freq: Two times a day (BID) | ORAL | 1 refills | Status: DC

## 2023-08-07 MED ORDER — ACCU-CHEK SOFTCLIX LANCETS MISC: 0 refills | Status: AC

## 2023-08-07 NOTE — Discharge Instructions (Addendum)
The groin itching is because you have yeast on your skin, and you need to keep this area dry You also need to control your sugar, since fungal infections get worse when your sugars are not controlled. Avoid eating sweets, breads, pasta, rice, cereal soda, or potatoes. Eat meat and vegetables so you have better control of your diabetes which is not controlled right now Increase your Metformin to 1000 mg twice a day with meals  We will call you when the vaginal swab test are back if positive.

## 2023-08-07 NOTE — ED Provider Notes (Signed)
[facility]    CSN: [id number] Arrival date & time: 08/07/23  1403      History   Chief Complaint Chief Complaint  Patient presents with   Vaginal Itching    HPI Beverly Roy is a 28 y.o. female who presents with vaginal itching and white discharge with an odor for the past few months. Was treated for yeast and BV in November and was well for a few days, then she started itching in her groin area where the itching is present, and saw white matter on her finger nails. She does not know the status of her DM since she is out of her glucose strips and does not have a PCP. She admits she sweats a lot.     Past Medical History:  Diagnosis Date   Asthma    Gestational diabetes    Hypertension     Patient Active Problem List   Diagnosis Date Noted   Cesarean delivery delivered 02/12/2021   History of cesarean section 11/01/2020   History of fetal anomaly in prior pregnancy, currently pregnant 11/01/2020   DM2 (diabetes mellitus, type 2) (HCC) 10/19/2020   Asthma 02/21/2017   ADHD 02/21/2017    Past Surgical History:  Procedure Laterality Date   CESAREAN SECTION     CESAREAN SECTION WITH BILATERAL TUBAL LIGATION N/A 02/12/2021   Procedure: CESAREAN SECTION WITH BILATERAL TUBAL LIGATION;  Surgeon: Venora Maples, MD;  Location: [facility] OR;  Service: Obstetrics;  Laterality: N/A;    OB History     Gravida  2   Para  2   Term  1   Preterm  1   AB      Living  3      SAB      IAB      Ectopic      Multiple  1   Live Births  3            Home Medications    Prior to Admission medications   Medication Sig Start Date End Date Taking? Authorizing Provider  Accu-Chek Softclix Lancets lancets Use as instructed 08/07/23  Yes Rodriguez-Southworth, Nettie Elm, PA-C  Glucose Blood (BLOOD GLUCOSE TEST STRIPS 333) STRP Accucheck glucose strips 08/07/23  Yes Rodriguez-Southworth, Nettie Elm, PA-C  ketoconazole (NIZORAL) 2 % cream Apply 1 Application topically  daily. For 7-10 days 08/07/23  Yes Rodriguez-Southworth, Nettie Elm, PA-C  nystatin (MYCOSTATIN/NYSTOP) powder Apply 1 Application topically 3 (three) times daily. 08/07/23  Yes Rodriguez-Southworth, Nettie Elm, PA-C  etonogestrel (NEXPLANON) 68 MG IMPL implant 1 each by Subdermal route once.    [provider]  metFORMIN (GLUCOPHAGE) 500 MG tablet Take 2 tablets (1,000 mg total) by mouth 2 (two) times daily with a meal. 08/07/23   Rodriguez-Southworth, Nettie Elm, PA-C    Family History Family History  Problem Relation Age of Onset   Hypertension Father    Diabetes Maternal Aunt    Cancer Maternal Aunt    Diabetes Maternal Grandmother     Social History Social History   Tobacco Use   Smoking status: Former    Current packs/day: 0.00    Types: Cigarettes    Quit date: 10/21/2016    Years since quitting: 6.7   Smokeless tobacco: Never  Vaping Use   Vaping status: Never Used  Substance Use Topics   Alcohol use: No   Drug use: Yes    Types: Marijuana    Comment: quit with +UPT     Allergies   Patient has no known  allergies.   Review of Systems Review of Systems As noted in HPI  Physical Exam Triage Vital Signs ED Triage Vitals  Encounter Vitals Group     BP 08/07/23 1535 138/87     Systolic BP Percentile --      Diastolic BP Percentile --      Pulse Rate 08/07/23 1535 (!) 104     Resp 08/07/23 1535 18     Temp 08/07/23 1535 97.8 F (36.6 C)     Temp Source 08/07/23 1535 Oral     SpO2 08/07/23 1535 99 %     Weight --      Height --      Head Circumference --      Peak Flow --      Pain Score 08/07/23 1536 0     Pain Loc --      Pain Education --      Exclude from Growth Chart --    No data found.  Updated Vital Signs BP 138/87 (BP Location: Left Arm)   Pulse (!) 104   Temp 97.8 F (36.6 C) (Oral)   Resp 18   SpO2 99%   Visual Acuity Right Eye Distance:   Left Eye Distance:   Bilateral Distance:    Right Eye Near:   Left Eye Near:    Bilateral  Near:     Physical Exam Vitals and nursing note reviewed.  Constitutional:      General: She is not in acute distress.    Appearance: She is obese. She is not toxic-appearing.  HENT:     Right Ear: External ear normal.     Left Ear: External ear normal.  Eyes:     General: No scleral icterus.    Conjunctiva/sclera: Conjunctivae normal.  Pulmonary:     Effort: Pulmonary effort is normal.  Musculoskeletal:     Cervical back: Neck supple.  Skin:    Comments: Has rash on inner thighs close to her groin region which looks fungal  Neurological:     Mental Status: She is alert and oriented to person, place, and time.     Gait: Gait normal.  Psychiatric:        Mood and Affect: Mood normal.        Behavior: Behavior normal.        Thought Content: Thought content normal.        Judgment: Judgment normal.      UC Treatments / Results  Labs (all labs ordered are listed, but only abnormal results are displayed) Labs Reviewed  POCT FASTING CBG [facility] MANUAL ENTRY - Abnormal; Notable for the following components:      Result Value   POCT Glucose ([facility]) 267 (*)    All other components within normal limits  CERVICOVAGINAL ANCILLARY ONLY    EKG   Radiology No results found.  Procedures Procedures (including critical care time)  Medications Ordered in UC Medications - No data to display  Initial Impression / Assessment and Plan / UC Course  I have reviewed the triage vital signs and the nursing notes.  Pertinent labs  results that were available during my care of the patient were reviewed by me and considered in my medical decision making (see chart for details).  Tinea cruris Uncontrolled DM  I reviewed with her what she eats and taught her how to avoid carbs, fruits high is sugar, and may have berries instead. See instructions.  I sent Rx for glucose strips and lancets  since she is out.  She was made an appointment with PCP before she left to have FU.    Final Clinical  Impressions(s) / UC Diagnoses   Final diagnoses:  Vaginal itching  Tinea cruris  Uncontrolled other specified diabetes mellitus with hyperglycemia (HCC)     Discharge Instructions      The groin itching is because you have yeast on your skin, and you need to keep this area dry You also need to control your sugar, since fungal infections get worse when your sugars are not controlled. Avoid eating sweets, breads, pasta, rice, cereal soda, or potatoes. Eat meat and vegetables so you have better control of your diabetes which is not controlled right now Increase your Metformin to 1000 mg twice a day with meals  We will call you when the vaginal swab test are back if positive.      ED Prescriptions     Medication Sig Dispense Auth. Provider   ketoconazole (NIZORAL) 2 % cream Apply 1 Application topically daily. For 7-10 days 60 g Rodriguez-Southworth, Torrence Branagan, PA-C   nystatin (MYCOSTATIN/NYSTOP) powder Apply 1 Application topically 3 (three) times daily. 15 g Rodriguez-Southworth, Nettie Elm, PA-C   Glucose Blood (BLOOD GLUCOSE TEST STRIPS 333) STRP Accucheck glucose strips 100 strip Rodriguez-Southworth, Kathren Scearce, PA-C   Accu-Chek Softclix Lancets lancets Use as instructed 100 each Rodriguez-Southworth, Nettie Elm, PA-C   metFORMIN (GLUCOPHAGE) 500 MG tablet Take 2 tablets (1,000 mg total) by mouth 2 (two) times daily with a meal. 60 tablet Rodriguez-Southworth, Nettie Elm, PA-C      PDMP not reviewed this encounter.   Garey Ham, [place] 08/07/23 1642

## 2023-08-07 NOTE — ED Triage Notes (Signed)
Pt c/o vaginal itching and a white d/c with odor for past few months. States was tx'd for yeast and BV but it never went away.

## 2023-08-08 ENCOUNTER — Telehealth (HOSPITAL_BASED_OUTPATIENT_CLINIC_OR_DEPARTMENT_OTHER): Payer: Self-pay

## 2023-08-08 LAB — CERVICOVAGINAL ANCILLARY ONLY
Bacterial Vaginitis (gardnerella): NEGATIVE
Candida Glabrata: POSITIVE — AB
Candida Vaginitis: POSITIVE — AB
Chlamydia: NEGATIVE
Comment: NEGATIVE
Comment: NEGATIVE
Comment: NEGATIVE
Comment: NEGATIVE
Comment: NEGATIVE
Comment: NORMAL
Neisseria Gonorrhea: NEGATIVE
Trichomonas: NEGATIVE

## 2023-08-08 MED ORDER — FLUCONAZOLE 150 MG PO TABS: 150.0000 mg | ORAL_TABLET | Freq: Once | ORAL | 0 refills | Status: AC

## 2023-08-08 NOTE — Telephone Encounter (Signed)
Per protocol, pt requires tx with Diflucan. Attempted to reach patient x1. LVM. Rx sent to pharmacy on file.

## 2023-08-22 ENCOUNTER — Ambulatory Visit: Payer: Medicaid Other | Admitting: Nurse Practitioner

## 2023-12-07 ENCOUNTER — Encounter (HOSPITAL_COMMUNITY): Payer: Self-pay | Admitting: Emergency Medicine

## 2023-12-07 ENCOUNTER — Other Ambulatory Visit: Payer: Self-pay

## 2023-12-07 ENCOUNTER — Ambulatory Visit (HOSPITAL_COMMUNITY)
Admission: EM | Admit: 2023-12-07 | Discharge: 2023-12-07 | Disposition: A | Discharge status: Home / Self Care | Attending: Family Medicine | Admitting: Family Medicine

## 2023-12-07 DIAGNOSIS — L03012 Cellulitis of left finger: Secondary | ICD-10-CM

## 2023-12-07 DIAGNOSIS — E119 Type 2 diabetes mellitus without complications: Secondary | ICD-10-CM

## 2023-12-07 MED ORDER — KETOROLAC TROMETHAMINE 30 MG/ML IJ SOLN
30.0000 mg | Freq: Once | INTRAMUSCULAR | Status: AC
  Administered 2023-12-07: 30 mg via INTRAMUSCULAR

## 2023-12-07 MED ORDER — AMOXICILLIN-POT CLAVULANATE 875-125 MG PO TABS
1.0000 | ORAL_TABLET | Freq: Two times a day (BID) | ORAL | 0 refills | Status: AC
Start: 2023-12-07 — End: 2023-12-14

## 2023-12-07 MED ORDER — KETOROLAC TROMETHAMINE 30 MG/ML IJ SOLN
INTRAMUSCULAR | Status: AC
  Filled 2023-12-07: qty 1

## 2023-12-07 MED ORDER — METFORMIN HCL 500 MG PO TABS: 1000.0000 mg | ORAL_TABLET | Freq: Two times a day (BID) | ORAL | 1 refills | Status: AC

## 2023-12-07 MED ORDER — KETOROLAC TROMETHAMINE 10 MG PO TABS: 10.0000 mg | ORAL_TABLET | Freq: Four times a day (QID) | ORAL | 0 refills | Status: DC | PRN

## 2023-12-07 NOTE — Discharge Instructions (Addendum)
 You have been given a shot of Toradol  30 mg today.  Ketorolac  10 mg tablets--take 1 tablet every 6 hours as needed for pain.  This is the same medicine that is in the shot we just gave you  Take amoxicillin-clavulanate 875 mg--1 tab twice daily with food for 7 days  Continue metformin  500 mg 2 tablets 2 times daily.  Please follow-up with primary care about your diabetes  If your finger gets more swollen or more painful, despite the treatment provided, please go to the emergency room.

## 2023-12-07 NOTE — ED Triage Notes (Signed)
 Left hand ring finger swollen around the nail for almost a  week very painful.

## 2023-12-07 NOTE — ED Provider Notes (Signed)
 [facility]    CSN: [id number] Arrival date & time: 12/07/23  1223      History   Chief Complaint Chief Complaint  Patient presents with   Hand Pain    HPI Beverly Roy is a 28 y.o. female.    Hand Pain  Here for swelling of her left ring finger around the nail in the distal finger for a few days.  It got worse in the last 2 days.  No fever or discharge.  She does have diabetes, but is not checking her sugar.  She does not have a PCP.  She just ran out of her metformin .  She states that when we last did her test strips prescription here we did not put instructions. NKDA  Last menstrual cycle was a while ago, as she has an implant.   Past Medical History:  Diagnosis Date   Asthma    Gestational diabetes    Hypertension     Patient Active Problem List   Diagnosis Date Noted   Cesarean delivery delivered 02/12/2021   History of cesarean section 11/01/2020   History of fetal anomaly in prior pregnancy, currently pregnant 11/01/2020   DM2 (diabetes mellitus, type 2) (HCC) 10/19/2020   Asthma 02/21/2017   ADHD 02/21/2017    Past Surgical History:  Procedure Laterality Date   CESAREAN SECTION     CESAREAN SECTION WITH BILATERAL TUBAL LIGATION N/A 02/12/2021   Procedure: CESAREAN SECTION WITH BILATERAL TUBAL LIGATION;  Surgeon: Teena Feast, MD;  Location: [facility] OR;  Service: Obstetrics;  Laterality: N/A;    OB History     Gravida  2   Para  2   Term  1   Preterm  1   AB      Living  3      SAB      IAB      Ectopic      Multiple  1   Live Births  3            Home Medications    Prior to Admission medications   Medication Sig Start Date End Date Taking? Authorizing Provider  amoxicillin-clavulanate (AUGMENTIN) 875-125 MG tablet Take 1 tablet by mouth 2 (two) times daily for 7 days. 12/07/23 12/14/23 Yes Ann Keto, MD  ketorolac  (TORADOL ) 10 MG tablet Take 1 tablet (10 mg total) by mouth every 6 (six) hours as  needed (pain). 12/07/23  Yes Ann Keto, MD  Accu-Chek Softclix Lancets lancets Use as instructed 08/07/23   Rodriguez-Southworth, Lamond Pilot, PA-C  etonogestrel  (NEXPLANON ) 68 MG IMPL implant 1 each by Subdermal route once.    [provider]  Glucose Blood (BLOOD GLUCOSE TEST STRIPS 333) STRP Accucheck glucose strips 08/07/23   Rodriguez-Southworth, Lamond Pilot, PA-C  ketoconazole  (NIZORAL ) 2 % cream Apply 1 Application topically daily. For 7-10 days 08/07/23   Rodriguez-Southworth, Sylvia, PA-C  metFORMIN  (GLUCOPHAGE ) 500 MG tablet Take 2 tablets (1,000 mg total) by mouth 2 (two) times daily with a meal. 12/07/23   Daijah Scrivens, Paige Boatman, MD  nystatin  (MYCOSTATIN /NYSTOP ) powder Apply 1 Application topically 3 (three) times daily. 08/07/23   Rodriguez-Southworth, Lamond Pilot, PA-C    Family History Family History  Problem Relation Age of Onset   Hypertension Father    Diabetes Maternal Aunt    Cancer Maternal Aunt    Diabetes Maternal Grandmother     Social History Social History   Tobacco Use   Smoking status: Former    Current packs/day: 0.00  Types: Cigarettes    Quit date: 10/21/2016    Years since quitting: 7.1   Smokeless tobacco: Never  Vaping Use   Vaping status: Never Used  Substance Use Topics   Alcohol use: No   Drug use: Yes    Types: Marijuana    Comment: quit with +UPT     Allergies   Patient has no known allergies.   Review of Systems Review of Systems   Physical Exam Triage Vital Signs ED Triage Vitals  Encounter Vitals Group     BP 12/07/23 1240 126/87     Systolic BP Percentile --      Diastolic BP Percentile --      Pulse Rate 12/07/23 1240 100     Resp 12/07/23 1240 18     Temp 12/07/23 1240 98.3 F (36.8 C)     Temp Source 12/07/23 1240 Oral     SpO2 12/07/23 1240 100 %     Weight --      Height --      Head Circumference --      Peak Flow --      Pain Score 12/07/23 1239 10     Pain Loc --      Pain Education --      Exclude from  Growth Chart --    No data found.  Updated Vital Signs BP 126/87 (BP Location: Right Arm)   Pulse 100   Temp 98.3 F (36.8 C) (Oral)   Resp 18   SpO2 100%   Visual Acuity Right Eye Distance:   Left Eye Distance:   Bilateral Distance:    Right Eye Near:   Left Eye Near:    Bilateral Near:     Physical Exam Vitals reviewed.  Constitutional:      General: She is not in acute distress.    Appearance: She is not ill-appearing, toxic-appearing or diaphoretic.  Skin:    Coloration: Skin is not jaundiced or pale.     Comments: There is erythema and tenderness of the volar surface of her left ring finger.  There is also some whitish-yellowish discoloration medial to the medial nail fold of the right ring finger nail.  Neurological:     Mental Status: She is alert.      UC Treatments / Results  Labs (all labs ordered are listed, but only abnormal results are displayed) Labs Reviewed - No data to display  EKG   Radiology No results found.  Procedures Procedures (including critical care time)  Medications Ordered in UC Medications  ketorolac  (TORADOL ) 30 MG/ML injection 30 mg (30 mg Intramuscular Given 12/07/23 1353)    Initial Impression / Assessment and Plan / UC Course  I have reviewed the triage vital signs and the nursing notes.  Pertinent labs & imaging results that were available during my care of the patient were reviewed by me and considered in my medical decision making (see chart for details).      Risk and benefits of paronychia drainage are discussed with her, as is the risk and benefits of doing digital anesthesia versus pain ease and direct drainage of the paronychia.  She gives verbal consent.  Alcohol is used to cleanse the area and pain uses spray to try to help the pain.  Number 18-gauge is used under clean conditions to make a very small stab wound underneath the medial nail fold of that ring finger.  Small amount of pus is obtained.  Bacitracin  and a bandage  were applied.  Toradol  injection is given and ketorolac  tablets are sent to the pharmacy.  Augmentin is sent for the infection.  Staff helped her make a PCP appointment Final Clinical Impressions(s) / UC Diagnoses   Final diagnoses:  Paronychia of finger of left hand  Type 2 diabetes mellitus without complication, without long-term current use of insulin (HCC)     Discharge Instructions      You have been given a shot of Toradol  30 mg today.  Ketorolac  10 mg tablets--take 1 tablet every 6 hours as needed for pain.  This is the same medicine that is in the shot we just gave you  Take amoxicillin-clavulanate 875 mg--1 tab twice daily with food for 7 days  Continue metformin  500 mg 2 tablets 2 times daily.  Please follow-up with primary care about your diabetes  If your finger gets more swollen or more painful, despite the treatment provided, please go to the emergency room.  ED Prescriptions     Medication Sig Dispense Auth. Provider   metFORMIN  (GLUCOPHAGE ) 500 MG tablet Take 2 tablets (1,000 mg total) by mouth 2 (two) times daily with a meal. 120 tablet Brenda Samano K, MD   amoxicillin-clavulanate (AUGMENTIN) 875-125 MG tablet Take 1 tablet by mouth 2 (two) times daily for 7 days. 14 tablet Genine Beckett K, MD   ketorolac  (TORADOL ) 10 MG tablet Take 1 tablet (10 mg total) by mouth every 6 (six) hours as needed (pain). 20 tablet Abdulkareem Badolato K, MD      PDMP not reviewed this encounter.   Ann Keto, MD 12/07/23 [phone]

## 2023-12-30 DIAGNOSIS — Z0001 Encounter for general adult medical examination with abnormal findings: Secondary | ICD-10-CM | POA: Diagnosis not present

## 2023-12-30 DIAGNOSIS — F419 Anxiety disorder, unspecified: Secondary | ICD-10-CM | POA: Diagnosis not present

## 2023-12-30 DIAGNOSIS — R635 Abnormal weight gain: Secondary | ICD-10-CM | POA: Diagnosis not present

## 2023-12-30 DIAGNOSIS — Z136 Encounter for screening for cardiovascular disorders: Secondary | ICD-10-CM | POA: Diagnosis not present

## 2023-12-30 DIAGNOSIS — F909 Attention-deficit hyperactivity disorder, unspecified type: Secondary | ICD-10-CM | POA: Diagnosis not present

## 2023-12-30 DIAGNOSIS — E1165 Type 2 diabetes mellitus with hyperglycemia: Secondary | ICD-10-CM | POA: Diagnosis not present

## 2024-01-08 DIAGNOSIS — E1165 Type 2 diabetes mellitus with hyperglycemia: Secondary | ICD-10-CM | POA: Diagnosis not present

## 2024-01-08 DIAGNOSIS — E782 Mixed hyperlipidemia: Secondary | ICD-10-CM | POA: Diagnosis not present

## 2024-01-08 DIAGNOSIS — Z6838 Body mass index (BMI) 38.0-38.9, adult: Secondary | ICD-10-CM | POA: Diagnosis not present

## 2024-01-08 DIAGNOSIS — E66812 Obesity, class 2: Secondary | ICD-10-CM | POA: Diagnosis not present

## 2024-01-26 ENCOUNTER — Encounter: Payer: Self-pay | Admitting: Internal Medicine

## 2024-01-26 ENCOUNTER — Ambulatory Visit: Admitting: Internal Medicine

## 2024-01-26 NOTE — Progress Notes (Deleted)
 [facility]IMARY CARE [facility] [address] [place] [address] Dept: [phone] Dept Fax: [phone]  New Patient Office Visit  Subjective:   Beverly Roy [birth year] 01/26/2024  No chief complaint on file.   HPI: Beverly Roy presents today to establish care at [facility] Introduced to Publishing rights manager role and practice setting.  All questions answered.  Concerns: See below      The following portions of the patient's history were reviewed and updated as appropriate: past medical history, past surgical history, family history, social history, allergies, medications, and problem list.   Patient Active Problem List   Diagnosis Date Noted   Cesarean delivery delivered 02/12/2021   History of cesarean section 11/01/2020   History of fetal anomaly in prior pregnancy, currently pregnant 11/01/2020   DM2 (diabetes mellitus, type 2) (HCC) 10/19/2020   Asthma 02/21/2017   ADHD 02/21/2017   Past Medical History:  Diagnosis Date   Asthma    Gestational diabetes    Hypertension    Past Surgical History:  Procedure Laterality Date   CESAREAN SECTION     CESAREAN SECTION WITH BILATERAL TUBAL LIGATION N/A 02/12/2021   Procedure: CESAREAN SECTION WITH BILATERAL TUBAL LIGATION;  Surgeon: Lola Donnice HERO, MD;  Location: [facility] OR;  Service: Obstetrics;  Laterality: N/A;   Family History  Problem Relation Age of Onset   Hypertension Father    Diabetes Maternal Aunt    Cancer Maternal Aunt    Diabetes Maternal Grandmother     Current Outpatient Medications:    Accu-Chek Softclix Lancets lancets, Use as instructed, Disp: 100 each, Rfl: 0   etonogestrel  (NEXPLANON ) 68 MG IMPL implant, 1 each by Subdermal route once., Disp: , Rfl:    Glucose Blood (BLOOD GLUCOSE TEST STRIPS 333) STRP, Accucheck glucose strips, Disp: 100 strip, Rfl: 0   ketoconazole  (NIZORAL ) 2 % cream, Apply 1 Application topically daily.  For 7-10 days, Disp: 60 g, Rfl: 0   ketorolac  (TORADOL ) 10 MG tablet, Take 1 tablet (10 mg total) by mouth every 6 (six) hours as needed (pain)., Disp: 20 tablet, Rfl: 0   metFORMIN  (GLUCOPHAGE ) 500 MG tablet, Take 2 tablets (1,000 mg total) by mouth 2 (two) times daily with a meal., Disp: 120 tablet, Rfl: 1   nystatin  (MYCOSTATIN /NYSTOP ) powder, Apply 1 Application topically 3 (three) times daily., Disp: 15 g, Rfl: 0 No Known Allergies  ROS: A complete ROS was performed with pertinent positives/negatives noted in the HPI. The remainder of the ROS are negative.   Objective:   There were no vitals filed for this visit.  GENERAL: Well-appearing, in NAD. Well nourished.  SKIN: Pink, warm and dry. No rash, lesion, ulceration, or ecchymoses.  NECK: Trachea midline. Full ROM w/o pain or tenderness. No lymphadenopathy.  RESPIRATORY: Chest wall symmetrical. Respirations even and non-labored. Breath sounds clear to auscultation bilaterally.  CARDIAC: S1, S2 present, regular rate and rhythm. Peripheral pulses 2+ bilaterally.  EXTREMITIES: Without clubbing, cyanosis, or edema.  NEUROLOGIC: No motor or sensory deficits. Steady, even gait.  PSYCH/MENTAL STATUS: Alert, oriented x 3. Cooperative, appropriate mood and affect.   Health Maintenance Due  Topic Date Due   HEMOGLOBIN A1C  Never done   FOOT EXAM  Never done   OPHTHALMOLOGY EXAM  Never done   Diabetic kidney evaluation - Urine ACR  Never done   Pneumococcal Vaccine 54-38 Years old (1 of 2 - PCV) Never done   Hepatitis B Vaccines (1 of 3 - 19+  3-dose series) Never done   Diabetic kidney evaluation - eGFR measurement  02/12/2022   HPV VACCINES (1 - Risk 3-dose SCDM series) Never done   COVID-19 Vaccine (1 - 2024-25 season) Never done   Cervical Cancer Screening (Pap smear)  11/02/2023    No results found for any visits on 01/26/24.  Assessment & Plan:   No orders of the defined types were placed in this encounter.  No orders of the  defined types were placed in this encounter.   No follow-ups on file.   Rosina Senters, FNP

## 2024-02-18 DIAGNOSIS — E1165 Type 2 diabetes mellitus with hyperglycemia: Secondary | ICD-10-CM | POA: Diagnosis not present

## 2024-02-18 DIAGNOSIS — Z6838 Body mass index (BMI) 38.0-38.9, adult: Secondary | ICD-10-CM | POA: Diagnosis not present

## 2024-02-18 DIAGNOSIS — E66812 Obesity, class 2: Secondary | ICD-10-CM | POA: Diagnosis not present

## 2024-02-18 DIAGNOSIS — E782 Mixed hyperlipidemia: Secondary | ICD-10-CM | POA: Diagnosis not present

## 2024-03-09 ENCOUNTER — Encounter: Payer: Self-pay | Admitting: Dietician

## 2024-03-09 ENCOUNTER — Encounter: Attending: Internal Medicine | Admitting: Dietician

## 2024-03-09 VITALS — Wt 219.0 lb

## 2024-03-09 DIAGNOSIS — E119 Type 2 diabetes mellitus without complications: Secondary | ICD-10-CM | POA: Diagnosis present

## 2024-03-09 NOTE — Patient Instructions (Signed)
 Goals Established by Patient:   Goal 1: swap juice for crystal light or mio drink.   Goal 2: go walking for 30 minutes 5 days a week.   Goal 3: include a lunch midday instead of just snacks (try to make it like the Plate Method, include 1/2 plate non-starchy vegetables, 1/4 plate protein, and 1/4 plate complex carbs).

## 2024-03-09 NOTE — Progress Notes (Signed)
 Diabetes Self-Management Education  Visit Type: First/Initial  Appt. Start Time: 1030 Appt. End Time: 1130  03/09/2024  [name], identified by name and date of birth, is a 28 y.o. female with a diagnosis of Diabetes: Type 2.   ASSESSMENT  History includes: type 2 diabetes, HLD,  Labs noted: A1c 12.9% 12/30/23 Medications include: metformin , ozempic, glimepiride Supplements: biotin (hair, skin and nail)  Pt reports she started ozempic about a month ago but states she feels like she is eating more. Pt reports she is caregiving for her mom who has uterine cancer, grandma also lives with them, and has 70 52 year old sons and a 10 year old daughter. Pt reports she notices she feels she is stress eating.   Pt reports typically having breakfast and dinner, with snacks in between (ice cream, brownie, chips).   Pt reports she drinks 3 of her 40 oz tumblers daily of half juicy-juice and half water.   Pt reports she checks her blood glucose 1-2 times daily and reports it is typically in the 300s. Pt current blood glucose is 302mg /dL. Pt reports sometimes it is in the 400s. Pt reports she has blurry vision but no nausea or vomiting.   Pt reports she is getting eye/dental exams next month.   Pt reports she can't leave her mom unattended for long but if she is sleeping she will sometimes go walking around the mall parking lots for 30 minutes about twice a week.   Weight 219 lb (99.3 kg). Body mass index is 40.06 kg/m.   Diabetes Self-Management Education - 03/09/24 1034       Visit Information   Visit Type First/Initial      Initial Visit   Diabetes Type Type 2    Date Diagnosed 3 years ago    Are you currently following a meal plan? No    Are you taking your medications as prescribed? Yes      Health Coping   How would you rate your overall health? Fair      Psychosocial Assessment   What is the hardest part about your diabetes right now, causing you the most concern, or  is the most worrisome to you about your diabetes?   Making healty food and beverage choices    Self-care barriers None    Self-management support Doctor's office    Other persons present Patient    Patient Concerns Nutrition/Meal planning    Special Needs None    Preferred Learning Style No preference indicated    Learning Readiness Ready    How often do you need to have someone help you when you read instructions, pamphlets, or other written materials from your doctor or pharmacy? 1 - Never    What is the last grade level you completed in school? 12      Pre-Education Assessment   Patient understands the diabetes disease and treatment process. Needs Instruction    Patient understands incorporating nutritional management into lifestyle. Needs Instruction    Patient undertands incorporating physical activity into lifestyle. Needs Instruction    Patient understands using medications safely. Needs Instruction    Patient understands monitoring blood glucose, interpreting and using results Needs Instruction    Patient understands prevention, detection, and treatment of acute complications. Needs Instruction    Patient understands prevention, detection, and treatment of chronic complications. Needs Instruction    Patient understands how to develop strategies to address psychosocial issues. Needs Instruction    Patient understands how to develop  strategies to promote health/change behavior. Needs Instruction      Complications   Last HgB A1C per patient/outside source 12.9 %    How often do you check your blood sugar? 1-2 times/day    Fasting Blood glucose range (mg/dL) >799    Have you had a dilated eye exam in the past 12 months? No    Have you had a dental exam in the past 12 months? No    Are you checking your feet? No      Dietary Intake   Breakfast boiled eggs and [place] bacon and grits    Snack (morning) cheese its or brownie    Lunch none    Snack (afternoon) brownie or ice cream     Dinner rice, corn, baked chicken    Snack (evening) scoop of ice cream OR none    Beverage(s) 60 oz water, 60 oz juicy juice, 1 coffee, occasional tea/soda      Activity / Exercise   Activity / Exercise Type ADL's;Light (walking / raking leaves)    How many days per week do you exercise? 2    How many minutes per day do you exercise? 30    Total minutes per week of exercise 60      Patient Education   Previous Diabetes Education No    Disease Pathophysiology Definition of diabetes, type 1 and 2, and the diagnosis of diabetes;Explored patient's options for treatment of their diabetes;Factors that contribute to the development of diabetes    Healthy Eating Role of diet in the treatment of diabetes and the relationship between the three main macronutrients and blood glucose level;Plate Method;Reviewed blood glucose goals for pre and post meals and how to evaluate the patients' food intake on their blood glucose level.;Meal options for control of blood glucose level and chronic complications.;Information on hints to eating out and maintain blood glucose control.;Meal timing in regards to the patients' current diabetes medication.    Being Active Role of exercise on diabetes management, blood pressure control and cardiac health.;Helped patient identify appropriate exercises in relation to his/her diabetes, diabetes complications and other health issue.    Medications Reviewed patients medication for diabetes, action, purpose, timing of dose and side effects.    Monitoring Purpose and frequency of SMBG.;Identified appropriate SMBG and/or A1C goals.;Daily foot exams;Yearly dilated eye exam    Acute complications Taught prevention, symptoms, and  treatment of hypoglycemia - the 15 rule.;Discussed and identified patients' prevention, symptoms, and treatment of hyperglycemia.    Chronic complications Relationship between chronic complications and blood glucose control;Identified and discussed with patient   current chronic complications    Diabetes Stress and Support Identified and addressed patients feelings and concerns about diabetes;Worked with patient to identify barriers to care and solutions;Role of stress on diabetes    Lifestyle and Health Coping Lifestyle issues that need to be addressed for better diabetes care      Individualized Goals (developed by patient)   Nutrition General guidelines for healthy choices and portions discussed    Physical Activity Exercise 3-5 times per week;30 minutes per day    Medications take my medication as prescribed    Monitoring  Test my blood glucose as discussed    Problem Solving Eating Pattern    Reducing Risk examine blood glucose patterns;do foot checks daily;treat hypoglycemia with 15 grams of carbs if blood glucose less than 70mg /dL    Health Coping Ask for help with psychological, social, or emotional issues  Post-Education Assessment   Patient understands the diabetes disease and treatment process. Comprehends key points    Patient understands incorporating nutritional management into lifestyle. Comprehends key points    Patient undertands incorporating physical activity into lifestyle. Comprehends key points    Patient understands using medications safely. Comphrehends key points    Patient understands monitoring blood glucose, interpreting and using results Comprehends key points    Patient understands prevention, detection, and treatment of acute complications. Comprehends key points    Patient understands prevention, detection, and treatment of chronic complications. Comprehends key points    Patient understands how to develop strategies to address psychosocial issues. Comprehends key points    Patient understands how to develop strategies to promote health/change behavior. Comprehends key points      Outcomes   Expected Outcomes Demonstrated interest in learning. Expect positive outcomes    Future DMSE 4-6 wks    Program Status Not  Completed          Individualized Plan for Diabetes Self-Management Training:   Learning Objective:  Patient will have a greater understanding of diabetes self-management. Patient education plan is to attend individual and/or group sessions per assessed needs and concerns.   Plan:   Patient Instructions  Goals Established by Patient:   Goal 1: swap juice for crystal light or mio drink.   Goal 2: go walking for 30 minutes 5 days a week.   Goal 3: include a lunch midday instead of just snacks (try to make it like the Plate Method, include 1/2 plate non-starchy vegetables, 1/4 plate protein, and 1/4 plate complex carbs).   Expected Outcomes:  Demonstrated interest in learning. Expect positive outcomes  Education material provided: ADA - How to Thrive: A Guide for Your Journey with Diabetes, Meal plan card, and Snack sheet  If problems or questions, patient to contact team via:  Phone  Future DSME appointment: 4-6 wks

## 2024-04-20 DIAGNOSIS — F331 Major depressive disorder, recurrent, moderate: Secondary | ICD-10-CM | POA: Diagnosis not present

## 2024-04-20 DIAGNOSIS — F411 Generalized anxiety disorder: Secondary | ICD-10-CM | POA: Diagnosis not present

## 2024-04-21 ENCOUNTER — Telehealth (HOSPITAL_COMMUNITY): Payer: Self-pay

## 2024-04-21 ENCOUNTER — Ambulatory Visit: Admitting: Dietician

## 2024-04-21 NOTE — Telephone Encounter (Signed)
 Pt's chart was accessed due to pt being in backlogged list of potential PCP referrals. Since the time of referral, pt has been a no-show for PCP appt. No further action taken at this time.

## 2024-05-18 DIAGNOSIS — F331 Major depressive disorder, recurrent, moderate: Secondary | ICD-10-CM | POA: Diagnosis not present

## 2024-05-18 DIAGNOSIS — F411 Generalized anxiety disorder: Secondary | ICD-10-CM | POA: Diagnosis not present

## 2024-05-28 ENCOUNTER — Ambulatory Visit: Admitting: Dietician

## 2024-06-02 ENCOUNTER — Other Ambulatory Visit: Payer: Self-pay

## 2024-06-02 ENCOUNTER — Encounter (HOSPITAL_COMMUNITY): Payer: Self-pay

## 2024-06-02 ENCOUNTER — Emergency Department (HOSPITAL_COMMUNITY)
Admission: EM | Admit: 2024-06-02 | Discharge: 2024-06-02 | Disposition: A | Discharge status: Home / Self Care | Attending: Emergency Medicine | Admitting: Emergency Medicine

## 2024-06-02 DIAGNOSIS — T385X5A Adverse effect of other estrogens and progestogens, initial encounter: Secondary | ICD-10-CM | POA: Diagnosis not present

## 2024-06-02 DIAGNOSIS — R200 Anesthesia of skin: Secondary | ICD-10-CM | POA: Insufficient documentation

## 2024-06-02 DIAGNOSIS — R202 Paresthesia of skin: Secondary | ICD-10-CM | POA: Diagnosis not present

## 2024-06-02 MED ORDER — TRIAMCINOLONE ACETONIDE 0.1 % EX CREA: 1.0000 | TOPICAL_CREAM | Freq: Two times a day (BID) | CUTANEOUS | 0 refills | Status: AC | PRN

## 2024-06-02 NOTE — ED Provider Notes (Signed)
 [facility] AT [facility] Provider Note   CSN: [id number] Arrival date & time: 06/02/24  1208     Patient presents with: No chief complaint on file.   Beverly Roy is a 28 y.o. female who presents emergency department with chief complaint of Nexplanon  implant irritation.  Patient has had it for approximately 3 years.  She reports it has been becoming irritated and uncomfortable she has some localized numbness in the area but denies left arm weakness or swelling.  She has been seen by her primary care physician several times in the past was told that if it got worse to come here.  Patient reports that she went to the [facility] urgent care and then was sent here eventually because no one would do anything about it.  She does not have current GYN coverage.   HPI     Prior to Admission medications   Medication Sig Start Date End Date Taking? Authorizing Provider  Accu-Chek Softclix Lancets lancets Use as instructed 08/07/23   Rodriguez-Southworth, Kyra, PA-C  etonogestrel  (NEXPLANON ) 68 MG IMPL implant 1 each by Subdermal route once.    [provider]  Glucose Blood (BLOOD GLUCOSE TEST STRIPS 333) STRP Accucheck glucose strips 08/07/23   Rodriguez-Southworth, Kyra, PA-C  ketoconazole  (NIZORAL ) 2 % cream Apply 1 Application topically daily. For 7-10 days 08/07/23   Rodriguez-Southworth, Sylvia, PA-C  ketorolac  (TORADOL ) 10 MG tablet Take 1 tablet (10 mg total) by mouth every 6 (six) hours as needed (pain). 12/07/23   Vonna Sharlet POUR, MD  metFORMIN  (GLUCOPHAGE ) 500 MG tablet Take 2 tablets (1,000 mg total) by mouth 2 (two) times daily with a meal. 12/07/23   Banister, Sharlet POUR, MD  nystatin  (MYCOSTATIN /NYSTOP ) powder Apply 1 Application topically 3 (three) times daily. 08/07/23   Rodriguez-Southworth, Sylvia, PA-C    Allergies: Patient has no known allergies.    Review of Systems  Updated Vital Signs BP (!) 147/93 (BP Location: Right Arm)   Pulse  (!) 102   Temp 98.6 F (37 C)   Resp 16   Ht 5' 2 (1.575 m)   Wt 96.2 kg   LMP 05/31/2024 (Approximate)   SpO2 99%   BMI 38.78 kg/m   Physical Exam Vitals and nursing note reviewed.  Constitutional:      General: She is not in acute distress.    Appearance: She is well-developed. She is not diaphoretic.  HENT:     Head: Normocephalic and atraumatic.     Right Ear: External ear normal.     Left Ear: External ear normal.     Nose: Nose normal.     Mouth/Throat:     Mouth: Mucous membranes are moist.  Eyes:     General: No scleral icterus.    Conjunctiva/sclera: Conjunctivae normal.  Cardiovascular:     Rate and Rhythm: Normal rate and regular rhythm.     Heart sounds: Normal heart sounds. No murmur heard.    No friction rub. No gallop.  Pulmonary:     Effort: Pulmonary effort is normal. No respiratory distress.     Breath sounds: Normal breath sounds.  Abdominal:     General: Bowel sounds are normal. There is no distension.     Palpations: Abdomen is soft. There is no mass.     Tenderness: There is no abdominal tenderness. There is no guarding.  Musculoskeletal:     Cervical back: Normal range of motion.  Skin:    General: Skin is warm and  dry.     Comments: Left arm implant localized erythematous irritation and hypoesthesia normal grip strength.  Neurological:     Mental Status: She is alert and oriented to person, place, and time.  Psychiatric:        Behavior: Behavior normal.     (all labs ordered are listed, but only abnormal results are displayed) Labs Reviewed - No data to display  EKG: None  Radiology: No results found.   Procedures   Medications Ordered in the ED - No data to display                                  Medical Decision Making  Patient here with irritation of the Nexplanon .  Normal neurologic exam, no obvious swelling.  Patient given outpatient phone numbers for follow-up for her Nexplanon .  Discussed return precautions.      Final diagnoses:  Adverse effect of estradiol implant    ED Discharge Orders     None          Arloa Chroman, PA-C 06/02/24 1322    Rogelia Jerilynn RAMAN, MD 06/02/24 1329

## 2024-06-02 NOTE — ED Triage Notes (Signed)
 C/O left arm numbness that started yesterday afternoon.  Denies headaches/ dizziness.

## 2024-06-02 NOTE — ED Triage Notes (Signed)
 Pt had birthcontrol removed from arm and is experiencing numbness now.

## 2024-06-15 DIAGNOSIS — F331 Major depressive disorder, recurrent, moderate: Secondary | ICD-10-CM | POA: Diagnosis not present

## 2024-06-15 DIAGNOSIS — F411 Generalized anxiety disorder: Secondary | ICD-10-CM | POA: Diagnosis not present

## 2024-06-29 ENCOUNTER — Other Ambulatory Visit (HOSPITAL_COMMUNITY): Payer: Self-pay

## 2024-07-18 ENCOUNTER — Other Ambulatory Visit: Payer: Self-pay

## 2024-07-18 ENCOUNTER — Ambulatory Visit (HOSPITAL_COMMUNITY): Payer: Self-pay | Admitting: Physician Assistant

## 2024-07-18 ENCOUNTER — Ambulatory Visit (HOSPITAL_COMMUNITY)
Admission: EM | Admit: 2024-07-18 | Discharge: 2024-07-18 | Disposition: A | Discharge status: Home / Self Care | Attending: Physician Assistant | Admitting: Physician Assistant

## 2024-07-18 ENCOUNTER — Ambulatory Visit (HOSPITAL_COMMUNITY)

## 2024-07-18 ENCOUNTER — Encounter (HOSPITAL_COMMUNITY): Payer: Self-pay | Admitting: *Deleted

## 2024-07-18 DIAGNOSIS — S6991XA Unspecified injury of right wrist, hand and finger(s), initial encounter: Secondary | ICD-10-CM

## 2024-07-18 MED ORDER — IBUPROFEN 600 MG PO TABS: 600.0000 mg | ORAL_TABLET | Freq: Four times a day (QID) | ORAL | 0 refills | Status: AC | PRN

## 2024-07-18 NOTE — Discharge Instructions (Addendum)
 Your x-ray was negative for fracture or dislocation.  Your finger has been placed in a splint.  Take ibuprofen  600 mg every 6 hours as needed for pain.  Apply ice to finger on for 20 minutes and off for 20 minutes.  Elevate finger is much as possible.  If you develop any new or worsening symptoms or if your symptoms do not start to improve, please return here or follow-up with your primary care provider.  If your symptoms are severe, please go to the emergency room.  If needed, orthopedic information below: [facility] [address]  [address] [place], [place]  [address] [phone]

## 2024-07-18 NOTE — ED Provider Notes (Signed)
 " [facility]    CSN: [id number] Arrival date & time: 07/18/24  1619      History   Chief Complaint Chief Complaint  Patient presents with   Finger Injury    HPI Telina Kleckley is a 28 y.o. female.   This 28 year old female is being seen for complaints of right index finger pain and swelling.  She reports yesterday she was popping the knuckles of her fingers and her right index finger felt like it came out of place.  She reports since that time she has had pain and swelling.  She denies known injury or trauma.  She denies chest pain, shortness of breath.     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     Hypertension     Patient Active Problem List   Diagnosis Date Noted   DM2 (diabetes mellitus, type 2) (HCC) 10/19/2020   Asthma 02/21/2017   ADHD 02/21/2017    Past Surgical History:  Procedure Laterality Date   CESAREAN SECTION     CESAREAN SECTION WITH BILATERAL TUBAL LIGATION N/A 02/12/2021   Procedure: CESAREAN SECTION WITH BILATERAL TUBAL LIGATION;  Surgeon: Lola Donnice HERO, MD;  Location: [facility] OR;  Service: Obstetrics;  Laterality: N/A;    OB History     Gravida  2   Para  2   Term  1   Preterm  1   AB      Living  3      SAB      IAB      Ectopic      Multiple  1   Live Births  3            Home Medications    Prior to Admission medications  Medication Sig Start Date End Date Taking? Authorizing Provider  Accu-Chek Softclix Lancets lancets Use as instructed 08/07/23  Yes Rodriguez-Southworth, Kyra, PA-C  etonogestrel  (NEXPLANON ) 68 MG IMPL implant 1 each by Subdermal route once.   Yes  [provider]  Glucose Blood (BLOOD GLUCOSE TEST STRIPS 333) STRP Accucheck glucose strips 08/07/23  Yes Rodriguez-Southworth, Kyra, PA-C  metFORMIN  (GLUCOPHAGE ) 500 MG tablet Take 2 tablets (1,000 mg total) by mouth 2 (two) times daily with a meal. 12/07/23  Yes Banister, Sharlet POUR, MD  ketoconazole  (NIZORAL ) 2 % cream Apply 1 Application topically daily. For 7-10 days 08/07/23   Rodriguez-Southworth, Sylvia, PA-C  ketorolac  (TORADOL ) 10 MG tablet Take 1 tablet (10 mg total) by mouth every 6 (six) hours as needed (pain). 12/07/23   Vonna Sharlet POUR, MD  nystatin  (MYCOSTATIN /NYSTOP ) powder Apply 1 Application topically 3 (three) times daily. 08/07/23   Rodriguez-Southworth, Sylvia, PA-C  triamcinolone  cream (KENALOG ) 0.1 % Apply 1 Application topically 2 (two) times daily as needed. 06/02/24   Arloa Chroman, PA-C    Family History Family History  Problem Relation Age of Onset   Hypertension Father    Diabetes Maternal Aunt    Cancer Maternal Aunt    Diabetes Maternal Grandmother     Social History Social History[1]   Allergies   Patient has no known allergies.  Review of Systems Review of Systems  Constitutional:  Positive for activity change.  Respiratory:  Negative for shortness of breath.   Cardiovascular:  Negative for chest pain.  Musculoskeletal:  Positive for arthralgias and joint swelling.  Skin:  Negative for color change.  All other systems reviewed and are negative.    Physical Exam Triage Vital Signs ED Triage Vitals  Encounter Vitals Group     BP 07/18/24 1805 118/82     Girls Systolic BP Percentile --      Girls Diastolic BP Percentile --      Boys Systolic BP Percentile --      Boys Diastolic BP Percentile --      Pulse Rate 07/18/24 1805 97     Resp 07/18/24 1805 20     Temp 07/18/24 1805 98.2 F (36.8 C)     Temp src --      SpO2 07/18/24 1805 100 %     Weight --      Height --      Head Circumference --      Peak Flow --      Pain  Score 07/18/24 1802 10     Pain Loc --      Pain Education --      Exclude from Growth Chart --    No data found.  Updated Vital Signs BP 118/82   Pulse 97   Temp 98.2 F (36.8 C)   Resp 20   LMP 06/21/2024 (Approximate)   SpO2 100%   Visual Acuity Right Eye Distance:   Left Eye Distance:   Bilateral Distance:    Right Eye Near:   Left Eye Near:    Bilateral Near:     Physical Exam Vitals and nursing note reviewed.  Constitutional:      General: She is not in acute distress.    Appearance: She is well-developed. She is not ill-appearing or toxic-appearing.     Comments: Pleasant female appearing stated age found sitting in chair in no acute distress.  HENT:     Head: Normocephalic and atraumatic.  Eyes:     Conjunctiva/sclera: Conjunctivae normal.  Cardiovascular:     Rate and Rhythm: Normal rate and regular rhythm.     Heart sounds: Normal heart sounds. No murmur heard. Pulmonary:     Effort: Pulmonary effort is normal. No respiratory distress.     Breath sounds: Normal breath sounds.  Abdominal:     Palpations: Abdomen is soft.     Tenderness: There is no abdominal tenderness.  Musculoskeletal:     Right hand: Swelling and tenderness present. Decreased range of motion. Normal sensation. Normal capillary refill.     Comments: Right index finger with edema and tenderness to palpation.  Skin:    General: Skin is warm and dry.     Capillary Refill: Capillary refill takes less than 2 seconds.  Neurological:     Mental Status: She is alert.     Sensory: Sensation is intact.  Psychiatric:        Mood and Affect: Mood normal.      UC Treatments / Results  Labs (all labs ordered are listed, but only abnormal results are displayed) Labs Reviewed - No data to display  EKG   Radiology DG Finger Index Right Result Date: 07/18/2024 EXAM: 3 VIEW(S) XRAY OF THE FINGER(S) 07/18/2024 06:55:29 PM COMPARISON: None available. CLINICAL HISTORY: pain and swelling and  decreased mobility FINDINGS: BONES AND JOINTS: No acute fracture. No  malalignment. SOFT TISSUES: Mild soft tissue swelling. IMPRESSION: 1. Mild soft tissue swelling. Electronically signed by: Greig Pique MD 07/18/2024 07:22 PM EST RP Workstation: [id number]    Procedures Procedures (including critical care time)  Medications Ordered in UC Medications - No data to display  Initial Impression / Assessment and Plan / UC Course  I have reviewed the triage vital signs and the nursing notes.  Pertinent labs & imaging results that were available during my care of the patient were reviewed by me and considered in my medical decision making (see chart for details).     Vitals and triage reviewed, patient is hemodynamically stable.  Finger x-ray negative for acute fracture or malalignment, there is mild soft tissue swelling.  Finger splint applied in clinic.  Suspect finger sprain.  She is given prescription for ibuprofen  600 mg.  Advised supportive care with ice application, elevation.  Plan of care, follow-up care, return precautions given, no questions at this time. Final Clinical Impressions(s) / UC Diagnoses   Final diagnoses:  None   Discharge Instructions   None    ED Prescriptions   None    PDMP not reviewed this encounter.    [1]  Social History Tobacco Use   Smoking status: Former    Current packs/day: 0.00    Types: Cigarettes    Quit date: 10/21/2016    Years since quitting: 7.7   Smokeless tobacco: Never  Vaping Use   Vaping status: Never Used  Substance Use Topics   Alcohol use: No   Drug use: Yes    Types: Marijuana    Comment: quit with +UPT     Lennice Jon BROCKS, FNP 07/18/24 1958  "

## 2024-07-18 NOTE — ED Triage Notes (Signed)
 PT reports she was popping her fingers and her Rt index finger came out of place the patient can not bend Rt index finger and finger is swollen .

## 2024-07-18 NOTE — ED Notes (Signed)
 Reviewed work note

## 2024-09-01 ENCOUNTER — Ambulatory Visit: Admitting: Family Medicine
